# Patient Record
Sex: Female | Born: 1950
Health system: Southern US, Community
[De-identification: ages and names within clinical notes are randomized; demographics above are authoritative.]

## PROBLEM LIST (undated history)

## (undated) DIAGNOSIS — T7840XA Allergy, unspecified, initial encounter: Secondary | ICD-10-CM

## (undated) DIAGNOSIS — D649 Anemia, unspecified: Secondary | ICD-10-CM

## (undated) DIAGNOSIS — N816 Rectocele: Secondary | ICD-10-CM

## (undated) DIAGNOSIS — T4145XA Adverse effect of unspecified anesthetic, initial encounter: Secondary | ICD-10-CM

## (undated) DIAGNOSIS — F419 Anxiety disorder, unspecified: Secondary | ICD-10-CM

## (undated) DIAGNOSIS — T8859XA Other complications of anesthesia, initial encounter: Secondary | ICD-10-CM

## (undated) DIAGNOSIS — K219 Gastro-esophageal reflux disease without esophagitis: Secondary | ICD-10-CM

## (undated) DIAGNOSIS — M1990 Unspecified osteoarthritis, unspecified site: Secondary | ICD-10-CM

## (undated) HISTORY — PX: TONSILLECTOMY: SUR1361

## (undated) HISTORY — PX: COLONOSCOPY: SHX174

## (undated) HISTORY — DX: Unspecified osteoarthritis, unspecified site: M19.90

## (undated) HISTORY — DX: Anemia, unspecified: D64.9

## (undated) HISTORY — DX: Rectocele: N81.6

## (undated) HISTORY — PX: TONSILLECTOMY AND ADENOIDECTOMY: SHX28

## (undated) HISTORY — DX: Allergy, unspecified, initial encounter: T78.40XA

## (undated) HISTORY — PX: WISDOM TOOTH EXTRACTION: SHX21

## (undated) HISTORY — PX: SHOULDER ARTHROSCOPY: SHX128

---

## 1999-09-10 ENCOUNTER — Other Ambulatory Visit: Admission: RE | Admit: 1999-09-10 | Discharge: 1999-09-10 | Payer: Self-pay | Admitting: *Deleted

## 2006-12-22 ENCOUNTER — Ambulatory Visit (HOSPITAL_COMMUNITY): Admission: RE | Admit: 2006-12-22 | Discharge: 2006-12-22 | Payer: Self-pay | Admitting: Obstetrics and Gynecology

## 2007-12-29 ENCOUNTER — Ambulatory Visit (HOSPITAL_COMMUNITY): Admission: RE | Admit: 2007-12-29 | Discharge: 2007-12-29 | Payer: Self-pay | Admitting: Obstetrics and Gynecology

## 2010-03-12 ENCOUNTER — Ambulatory Visit (HOSPITAL_COMMUNITY): Admission: RE | Admit: 2010-03-12 | Discharge: 2010-03-12 | Payer: Self-pay | Admitting: Obstetrics and Gynecology

## 2010-12-15 ENCOUNTER — Encounter: Payer: Self-pay | Admitting: Family Medicine

## 2011-04-30 ENCOUNTER — Other Ambulatory Visit (HOSPITAL_COMMUNITY): Payer: Self-pay | Admitting: Obstetrics and Gynecology

## 2011-04-30 DIAGNOSIS — Z1231 Encounter for screening mammogram for malignant neoplasm of breast: Secondary | ICD-10-CM

## 2011-05-07 ENCOUNTER — Ambulatory Visit (HOSPITAL_COMMUNITY): Payer: PRIVATE HEALTH INSURANCE

## 2011-05-14 ENCOUNTER — Ambulatory Visit (HOSPITAL_COMMUNITY)
Admission: RE | Admit: 2011-05-14 | Discharge: 2011-05-14 | Disposition: A | Discharge status: Home / Self Care | Payer: PRIVATE HEALTH INSURANCE | Source: Ambulatory Visit | Attending: Obstetrics and Gynecology | Admitting: Obstetrics and Gynecology

## 2011-05-14 DIAGNOSIS — Z1231 Encounter for screening mammogram for malignant neoplasm of breast: Secondary | ICD-10-CM

## 2011-11-20 ENCOUNTER — Ambulatory Visit: Payer: Self-pay

## 2011-11-20 DIAGNOSIS — G4700 Insomnia, unspecified: Secondary | ICD-10-CM

## 2011-11-20 DIAGNOSIS — J209 Acute bronchitis, unspecified: Secondary | ICD-10-CM

## 2012-05-18 ENCOUNTER — Other Ambulatory Visit (HOSPITAL_COMMUNITY): Payer: Self-pay | Admitting: Obstetrics and Gynecology

## 2012-05-18 DIAGNOSIS — Z1231 Encounter for screening mammogram for malignant neoplasm of breast: Secondary | ICD-10-CM

## 2012-06-29 ENCOUNTER — Ambulatory Visit (HOSPITAL_COMMUNITY)
Admission: RE | Admit: 2012-06-29 | Discharge: 2012-06-29 | Disposition: A | Discharge status: Home / Self Care | Payer: No Typology Code available for payment source | Source: Ambulatory Visit | Attending: Obstetrics and Gynecology | Admitting: Obstetrics and Gynecology

## 2012-06-29 DIAGNOSIS — Z1231 Encounter for screening mammogram for malignant neoplasm of breast: Secondary | ICD-10-CM | POA: Insufficient documentation

## 2012-08-31 HISTORY — PX: OTHER SURGICAL HISTORY: SHX169

## 2013-05-18 ENCOUNTER — Other Ambulatory Visit (HOSPITAL_COMMUNITY): Payer: Self-pay | Admitting: Obstetrics and Gynecology

## 2013-05-18 DIAGNOSIS — Z1231 Encounter for screening mammogram for malignant neoplasm of breast: Secondary | ICD-10-CM

## 2013-05-18 DIAGNOSIS — Z780 Asymptomatic menopausal state: Secondary | ICD-10-CM

## 2013-06-30 ENCOUNTER — Ambulatory Visit (HOSPITAL_COMMUNITY)
Admission: RE | Admit: 2013-06-30 | Discharge: 2013-06-30 | Disposition: A | Discharge status: Home / Self Care | Payer: BC Managed Care – PPO | Source: Ambulatory Visit | Attending: Obstetrics and Gynecology | Admitting: Obstetrics and Gynecology

## 2013-06-30 ENCOUNTER — Other Ambulatory Visit (HOSPITAL_COMMUNITY): Payer: Self-pay | Admitting: Obstetrics and Gynecology

## 2013-06-30 DIAGNOSIS — Z780 Asymptomatic menopausal state: Secondary | ICD-10-CM

## 2013-06-30 DIAGNOSIS — Z13820 Encounter for screening for osteoporosis: Secondary | ICD-10-CM | POA: Insufficient documentation

## 2013-06-30 DIAGNOSIS — Z1231 Encounter for screening mammogram for malignant neoplasm of breast: Secondary | ICD-10-CM | POA: Insufficient documentation

## 2013-07-05 ENCOUNTER — Other Ambulatory Visit: Payer: Self-pay | Admitting: Orthopedic Surgery

## 2013-07-05 DIAGNOSIS — M25511 Pain in right shoulder: Secondary | ICD-10-CM

## 2013-07-10 ENCOUNTER — Ambulatory Visit
Admission: RE | Admit: 2013-07-10 | Discharge: 2013-07-10 | Disposition: A | Discharge status: Home / Self Care | Payer: BC Managed Care – PPO | Source: Ambulatory Visit | Attending: Orthopedic Surgery | Admitting: Orthopedic Surgery

## 2013-07-10 DIAGNOSIS — M25511 Pain in right shoulder: Secondary | ICD-10-CM

## 2013-07-22 HISTORY — PX: ROTATOR CUFF REPAIR: SHX139

## 2013-09-28 ENCOUNTER — Ambulatory Visit (INDEPENDENT_AMBULATORY_CARE_PROVIDER_SITE_OTHER): Payer: BC Managed Care – PPO | Admitting: General Surgery

## 2013-09-28 ENCOUNTER — Encounter (INDEPENDENT_AMBULATORY_CARE_PROVIDER_SITE_OTHER): Payer: Self-pay | Admitting: General Surgery

## 2013-09-28 ENCOUNTER — Encounter (INDEPENDENT_AMBULATORY_CARE_PROVIDER_SITE_OTHER): Payer: Self-pay

## 2013-09-28 VITALS — BP 150/108 | HR 72 | Temp 98.2°F | Resp 14 | Ht 64.0 in | Wt 113.2 lb

## 2013-09-28 DIAGNOSIS — K602 Anal fissure, unspecified: Secondary | ICD-10-CM | POA: Insufficient documentation

## 2013-09-28 MED ORDER — AMBULATORY NON FORMULARY MEDICATION: 1.0000 "application " | Freq: Four times a day (QID) | Status: DC

## 2013-09-28 NOTE — Progress Notes (Signed)
Patient ID: Stephanie Savage, female   DOB: March 10, 1951, 62 y.o.   MRN: [id number]  Chief Complaint  Patient presents with  . New Evaluation    eval hems    HPI Stephanie Savage is a 62 y.o. female.  We are asked to see the patient in consultation by Dr. Clelia Croft to evaluate her for hemorrhoids. The patient is a 62 year old white female who states that she has a history of a rectocele. She had surgery on her right shoulder in August and took pain medicine for this. The pain medicine caused her to be constipated and shortly thereafter she developed pain and swelling in her rectum. She continues to have a lot of pain with her bowel movements. She has quit eating to try to avoid having bowel movements. She denies a lot of bleeding with her bowel movements  HPI  Past Medical History  Diagnosis Date  . Anemia   . Arthritis   . Allergy     Past Surgical History  Procedure Laterality Date  . Rotator cuff repair Right 07/22/2013  . Shoulder Left [id number]    History reviewed. No pertinent family history.  Social History History  Substance Use Topics  . Smoking status: Never Smoker   . Smokeless tobacco: Never Used  . Alcohol Use: Yes    Allergies  Allergen Reactions  . Codeine Nausea Only  . Sulfa Antibiotics Hives    Current Outpatient Prescriptions  Medication Sig Dispense Refill  . Biotin (BIOTIN 5000) 5 MG CAPS Take by mouth.      . calcium acetate (PHOSLO) 667 MG capsule Take by mouth 3 (three) times daily with meals.      . celecoxib (CELEBREX) 200 MG capsule Take 200 mg by mouth 2 (two) times daily.      . cholecalciferol (VITAMIN D) 1000 UNITS tablet Take 1,000 Units by mouth daily.      Marland Kitchen estradiol-norethindrone (ACTIVELLA) 1-0.5 MG per tablet Take 1 tablet by mouth daily.      . fexofenadine (ALLEGRA) 30 MG tablet Take 30 mg by mouth 2 (two) times daily.      . fish oil-omega-3 fatty acids 1000 MG capsule Take 2 g by mouth daily.      . hydrocortisone (ANUSOL-HC) 25 MG  suppository Place 25 mg rectally 2 (two) times daily.      . hydrocortisone 2.5 % cream Apply topically 2 (two) times daily.      . methocarbamol (ROBAXIN) 500 MG tablet Take 500 mg by mouth 4 (four) times daily.      . methylphenidate (RITALIN) 20 MG tablet Take 20 mg by mouth 2 (two) times daily.      . Multiple Vitamin (MULTIVITAMIN) capsule Take 1 capsule by mouth daily.      . Probiotic Product (PROBIOTIC DAILY PO) Take by mouth.      . pyridOXINE (VITAMIN B-6) 100 MG tablet Take 100 mg by mouth daily.      . AMBULATORY NON FORMULARY MEDICATION Place 1 application rectally 4 (four) times daily. Diltiazem 2% compounded suspension.  1 Tube  2   No current facility-administered medications for this visit.    Review of Systems Review of Systems  Constitutional: Negative.   HENT: Negative.   Eyes: Negative.   Respiratory: Negative.   Cardiovascular: Negative.   Gastrointestinal: Positive for constipation and rectal pain.  Endocrine: Negative.   Genitourinary: Negative.   Musculoskeletal: Negative.   Skin: Negative.   Allergic/Immunologic: Negative.   Neurological: Negative.  Hematological: Negative.   Psychiatric/Behavioral: Negative.     Blood pressure 150/108, pulse 72, temperature 98.2 F (36.8 C), temperature source Temporal, resp. rate 14, height 5\' 4"  (1.626 m), weight 113 lb 3.2 oz (51.347 kg).  Physical Exam Physical Exam  Constitutional: She is oriented to person, place, and time. She appears well-developed and well-nourished.  HENT:  Head: Normocephalic and atraumatic.  Eyes: Conjunctivae and EOM are normal. Pupils are equal, round, and reactive to light.  Neck: Normal range of motion. Neck supple.  Cardiovascular: Normal rate, regular rhythm and normal heart sounds.   Pulmonary/Chest: Effort normal and breath sounds normal.  Abdominal: Soft. Bowel sounds are normal.  Genitourinary:  The patient has normal-appearing perirectal skin. Posteriorly she has a  sentinel tag and an obvious fissure  Musculoskeletal: Normal range of motion.  Neurological: She is alert and oriented to person, place, and time.  Skin: Skin is warm and dry.  Psychiatric: She has a normal mood and affect. Her behavior is normal.    Data Reviewed As above  Assessment    The patient appears to have a posterior anal fissure.     Plan    At this point I would recommend using diltiazem cream 4 times a day. She will take Colace and MiraLAX to have a normal soft bowel movement every day. She will use baby wipes after bowel movements. She will soak in a tub 2-3 times a day and after bowel movements. We will have her followup in 1 month with Dr. Maisie Fus who is our colorectal specialist to monitor her progress        TOTH III,PAUL S 09/28/2013, 10:41 AM

## 2013-09-28 NOTE — Patient Instructions (Signed)
Use colace and miralax to have soft bm everyday Warm tub soaks 2-3 times a day Baby wipes after bm's Diltiazem cream to rectum 4 times a day

## 2013-10-26 ENCOUNTER — Ambulatory Visit (INDEPENDENT_AMBULATORY_CARE_PROVIDER_SITE_OTHER): Payer: BC Managed Care – PPO | Admitting: General Surgery

## 2013-10-26 ENCOUNTER — Encounter (INDEPENDENT_AMBULATORY_CARE_PROVIDER_SITE_OTHER): Payer: Self-pay | Admitting: General Surgery

## 2013-10-26 VITALS — BP 150/100 | HR 76 | Temp 98.0°F | Resp 14 | Ht 64.0 in | Wt 111.4 lb

## 2013-10-26 DIAGNOSIS — K602 Anal fissure, unspecified: Secondary | ICD-10-CM

## 2013-10-26 DIAGNOSIS — K648 Other hemorrhoids: Secondary | ICD-10-CM

## 2013-10-26 DIAGNOSIS — K642 Third degree hemorrhoids: Secondary | ICD-10-CM

## 2013-10-26 NOTE — Progress Notes (Signed)
Chief Complaint  Patient presents with  . New Evaluation    eval for hems per toth    HISTORY: Stephanie Savage is a 62 y.o. female who presents to the office with rectal pain after BM's.  Other symptoms include bleeding, incomplete evacuation.  This had been occurring since Aug.  She has tried miralax and diltiazem cream in the past with some success.  Multiple BM's makes the symptoms worse.   It is intermittent in nature.  Her bowel habits are regular and her bowel movements are loose.  her fiber intake is dietary.  She has never had a colonoscopy.  She does have a know rectocele and she splints her vagina.  Past Medical History  Diagnosis Date  . Anemia   . Arthritis   . Allergy       Past Surgical History  Procedure Laterality Date  . Rotator cuff repair Right 07/22/2013  . Shoulder Left [id number]        Current Outpatient Prescriptions  Medication Sig Dispense Refill  . AMBULATORY NON FORMULARY MEDICATION Place 1 application rectally 4 (four) times daily. Diltiazem 2% compounded suspension.  1 Tube  2  . Biotin (BIOTIN 5000) 5 MG CAPS Take by mouth.      . calcium acetate (PHOSLO) 667 MG capsule Take by mouth 3 (three) times daily with meals.      . celecoxib (CELEBREX) 200 MG capsule Take 200 mg by mouth 2 (two) times daily.      . cholecalciferol (VITAMIN D) 1000 UNITS tablet Take 1,000 Units by mouth daily.      Marland Kitchen estradiol-norethindrone (ACTIVELLA) 1-0.5 MG per tablet Take 1 tablet by mouth daily.      . fexofenadine (ALLEGRA) 30 MG tablet Take 30 mg by mouth 2 (two) times daily.      . fish oil-omega-3 fatty acids 1000 MG capsule Take 2 g by mouth daily.      . hydrocortisone (ANUSOL-HC) 25 MG suppository Place 25 mg rectally 2 (two) times daily.      . hydrocortisone 2.5 % cream Apply topically 2 (two) times daily.      . methocarbamol (ROBAXIN) 500 MG tablet Take 500 mg by mouth 4 (four) times daily.      . methylphenidate (RITALIN) 20 MG tablet Take 20 mg by mouth 2  (two) times daily.      . Multiple Vitamin (MULTIVITAMIN) capsule Take 1 capsule by mouth daily.      . Probiotic Product (PROBIOTIC DAILY PO) Take by mouth.      . pyridOXINE (VITAMIN B-6) 100 MG tablet Take 100 mg by mouth daily.       No current facility-administered medications for this visit.      Allergies  Allergen Reactions  . Codeine Nausea Only  . Sulfa Antibiotics Hives      History reviewed. No pertinent family history.  History   Social History  . Marital Status: Married    Spouse Name: N/A    Number of Children: N/A  . Years of Education: N/A   Social History Main Topics  . Smoking status: Never Smoker   . Smokeless tobacco: Never Used  . Alcohol Use: Yes  . Drug Use: No  . Sexual Activity: None   Other Topics Concern  . None   Social History Narrative  . None      REVIEW OF SYSTEMS - PERTINENT POSITIVES ONLY: Review of Systems - General ROS: negative for - chills, fever or weight loss  Hematological and Lymphatic ROS: negative for - bleeding problems, blood clots or bruising Respiratory ROS: no cough, shortness of breath, or wheezing Cardiovascular ROS: no chest pain or dyspnea on exertion Gastrointestinal ROS: no abdominal pain, change in bowel habits, or black or bloody stools Genito-Urinary ROS: no dysuria, trouble voiding, or hematuria  EXAM: Filed Vitals:   10/26/13 1034  BP: 150/100  Pulse: 76  Temp: 98 F (36.7 C)  Resp: 14    General appearance: alert and cooperative Resp: clear to auscultation bilaterally Cardio: regular rate and rhythm GI: soft, non-tender; bowel sounds normal; no masses,  no organomegaly  Anal Exam Findings: edematous grade 3 internal hemorrhoid, posterior anal fissure with sentinel tag    ASSESSMENT AND PLAN: Stephanie Savage is a 89 y.o. F with a posterior anal fissure and prolapsing internal hemorrhoid.  I would like her to continue her diltiazem cream for 4 more weeks.  I will see her again then.  I have  asked her to start a methylcellulose fiber supplement to help with her loose stools.  I recommended that she get a colonoscopy once this has resolved.      Vanita Panda, MD Colon and Rectal Surgery / General Surgery [facility]      Visit Diagnoses: No diagnosis found.  Primary Care Physician: Kari Baars, MD

## 2013-10-26 NOTE — Patient Instructions (Signed)
GETTING TO GOOD BOWEL HEALTH. Irregular bowel habits such as diarrhea can lead to many problems over time.  Having one soft, formed bowel movement a day is the most important way to prevent further problems.  The anorectal canal is designed to handle stretching and feces to safely manage our ability to get rid of solid waste (feces, poop, stool) out of our body.  BUT, diarrhea can be a burning fire to this very sensitive area of our body, causing inflamed hemorrhoids, anal fissures, increasing risk is perirectal abscesses, abdominal pain and bloating.     The goal: ONE SOFT BOWEL MOVEMENT A DAY!  To have soft, regular bowel movements:    Drink at least 8 tall glasses of water a day.     Take plenty of fiber.  Fiber is the undigested part of plant food that passes into the colon, acting s "natures broom" to encourage bowel motility and movement.  Fiber can absorb and hold large amounts of water. This results in a larger, bulkier stool, which is soft and easier to pass. Work gradually over several weeks up to 6 servings a day of fiber (25g a day even more if needed) in the form of: o Vegetables -- Root (potatoes, carrots, turnips), leafy green (lettuce, salad greens, celery, spinach), or cooked high residue (cabbage, broccoli, etc) o Fruit -- Fresh (unpeeled skin & pulp), Dried (prunes, apricots, cherries, etc ),  or stewed ( applesauce)  o Whole grain breads, pasta, etc (whole wheat)  o Bran cereals    Bulking Agents -- This type of water-retaining fiber generally is easily obtained each day by one of the following:  o Methylcellulose -- This is another fiber derived from wood which also retains water. It is available as Citrucel and Benefiber. o Flax Seed - a less gassy fiber than psyllium   Controlling diarrhea o Switch to liquids and simpler foods for a few days to avoid stressing your intestines further. o Avoid dairy products (especially milk & ice cream) for a short time.  The intestines often can  lose the ability to digest lactose when stressed. o Avoid foods that cause gassiness or bloating.  Typical foods include beans and other legumes, cabbage, broccoli, and dairy foods.  Every person has some sensitivity to other foods, so listen to our body and avoid those foods that trigger problems for you. o Adding fiber (Citrucel, Metamucil, psyllium, Miralax) gradually can help thicken stools by absorbing excess fluid and retrain the intestines to act more normally.  Slowly increase the dose over a few weeks.  Too much fiber too soon can backfire and cause cramping & bloating. o Probiotics (such as active yogurt, Align, etc) may help repopulate the intestines and colon with normal bacteria and calm down a sensitive digestive tract.  Most studies show it to be of mild help, though, and such products can be costly. o Medicines:   Bismuth subsalicylate (ex. Kayopectate, Pepto Bismol) every 30 minutes for up to 6 doses can help control diarrhea.  Avoid if pregnant.   Loperamide (Immodium) can slow down diarrhea.  Start with two tablets (4mg  total) first and then try one tablet every 6 hours.  Avoid if you are having fevers or severe pain.  If you are not better or start feeling worse, stop all medicines and call your doctor for advice o Call your doctor if you are getting worse or not better.  Sometimes further testing (cultures, endoscopy, X-ray studies, bloodwork, etc) may be needed to help  diagnose and treat the cause of the diarrhea. o

## 2013-11-30 ENCOUNTER — Ambulatory Visit (INDEPENDENT_AMBULATORY_CARE_PROVIDER_SITE_OTHER): Payer: BC Managed Care – PPO | Admitting: General Surgery

## 2013-11-30 ENCOUNTER — Encounter (INDEPENDENT_AMBULATORY_CARE_PROVIDER_SITE_OTHER): Payer: Self-pay | Admitting: General Surgery

## 2013-11-30 ENCOUNTER — Telehealth (INDEPENDENT_AMBULATORY_CARE_PROVIDER_SITE_OTHER): Payer: Self-pay | Admitting: General Surgery

## 2013-11-30 VITALS — BP 140/90 | HR 68 | Temp 97.8°F | Resp 14 | Ht 64.0 in | Wt 110.0 lb

## 2013-11-30 DIAGNOSIS — K602 Anal fissure, unspecified: Secondary | ICD-10-CM

## 2013-11-30 NOTE — Patient Instructions (Signed)
[facility]  ONE-DAY (1) PRE-OP HOME COLON PREP INSTRUCTIONS: ** MIRALAX / GATORADE PREP **  You must follow the instructions below carefully.  If you have questions or problems, please call and speak to someone in the clinic department at our office:   [phone].     INSTRUCTIONS: 1. Five days prior to your procedure do not eat nuts, popcorn, or fruit with seeds.  Stop all fiber supplements such as Metamucil, Citrucel, etc. 2. Two days before surgery fill the prescription at a pharmacy of your choice and purchase the additional supplies below.         MIRALAX - GATORADE -- DULCOLAX TABS:   Purchase a bottle of MIRALAX  (238 gm bottle)    In addition, purchase four (4) DULCOLAX TABLETS (no prescription required- ask the pharmacist if you can't find them)    Purchase one 64 oz GATORADE.  (Do NOT purchase red Gatorade; any other flavor is acceptable) and place in refrigerator to get cold.  3.   Day Before Surgery:   6 am: take the 4 Dulcolax tablets   You may only have clear liquids (tea, coffee, juice, broth, jello, soft drinks, gummy bears).  You cannot have solid foods, cream, milk or milk products.  Drink at lease 8 ounces of liquids every hour while awake.   Mix the entire bottle of MiraLax and the Gatorade in a large container.    10:00am: Begin drinking the Gatorade mixture until gone (8 oz every 15-30 minutes).      You may suck on a lime wedge or hard candy to "freshen your palate" in between glasses   If you are a diabetic, take your blood sugar reading several time throughout the prep.  Have some juice available to take if your sugar level gets too low   You may feel chilled while taking the prep.  Have some warm tea or broth to help warm up.   Continue clear liquids until midnight or bedtime  3. The day of your procedure:   Do not eat or drink ANYTHING after midnight before your surgery.     If you take Heart or Blood Pressure medicine, ask the pre-op nurses about  these during your preop appointment.   Further pre-operative instructions will be given to you from the hospital.   Expect to be contacted 5-7 days before your surgery.  

## 2013-11-30 NOTE — Telephone Encounter (Signed)
That's fine

## 2013-11-30 NOTE — Telephone Encounter (Signed)
Patient met with surgery scheduling wants to schedule in March

## 2013-11-30 NOTE — Progress Notes (Signed)
Chief Complaint   Patient presents with   .  New Evaluation     eval for hems per toth   HISTORY: Stephanie Savage is a 63 y.o. female who presented to the office in early Dec with rectal pain after BM's. Other symptoms included bleeding, incomplete evacuation. This had been occurring since Aug. She has tried miralax and diltiazem cream in the past with some success. Multiple BM's maked the symptoms worse. It was intermittent in nature. Her bowel habits are regular and her bowel movements are loose. her fiber intake is dietary. She has never had a colonoscopy. She does have a know rectocele and she splints her vagina.  She has been on the diltiazem for a full course and has been using citrucel bid to help thicken her stools.  This has worked well and her pain is minimal now.   EXAM:  Filed Vitals:   11/30/13 1018  BP: 140/90  Pulse: 68  Temp: 97.8 F (36.6 C)  Resp: 14   General appearance: alert and cooperative  GI: soft, non-tender; bowel sounds normal; no masses, no organomegaly  Anal Exam Findings: grade 3 internal hemorrhoid, posterior anal fissure healing, sentinel tag, no sphincter hypertension noted  ASSESSMENT AND PLAN:  Stephanie Savage is a 63 y.o. F with a posterior anal fissure and prolapsing internal hemorrhoid.  She will cont her fiber supplements.  I have scheduled her for a colonoscopy in March, once this has resolved for screening purposes.  [name], MD  Colon and Rectal Surgery / General Surgery  [facility]

## 2014-01-19 ENCOUNTER — Telehealth (INDEPENDENT_AMBULATORY_CARE_PROVIDER_SITE_OTHER): Payer: Self-pay | Admitting: General Surgery

## 2014-01-19 NOTE — Telephone Encounter (Signed)
Talked to patient on 01/14/14 went over financial responsibilities, patient was going to call back to schedule.

## 2014-06-09 ENCOUNTER — Other Ambulatory Visit (HOSPITAL_COMMUNITY): Payer: Self-pay | Admitting: Obstetrics and Gynecology

## 2014-06-09 DIAGNOSIS — Z1231 Encounter for screening mammogram for malignant neoplasm of breast: Secondary | ICD-10-CM

## 2014-07-01 ENCOUNTER — Ambulatory Visit (HOSPITAL_COMMUNITY)
Admission: RE | Admit: 2014-07-01 | Discharge: 2014-07-01 | Disposition: A | Discharge status: Home / Self Care | Payer: BC Managed Care – PPO | Source: Ambulatory Visit | Attending: Obstetrics and Gynecology | Admitting: Obstetrics and Gynecology

## 2014-07-01 DIAGNOSIS — Z1231 Encounter for screening mammogram for malignant neoplasm of breast: Secondary | ICD-10-CM | POA: Insufficient documentation

## 2015-05-10 ENCOUNTER — Encounter: Payer: Self-pay | Admitting: Internal Medicine

## 2015-05-31 ENCOUNTER — Other Ambulatory Visit (HOSPITAL_COMMUNITY): Payer: Self-pay | Admitting: Obstetrics and Gynecology

## 2015-05-31 DIAGNOSIS — Z1231 Encounter for screening mammogram for malignant neoplasm of breast: Secondary | ICD-10-CM

## 2015-06-29 ENCOUNTER — Ambulatory Visit (AMBULATORY_SURGERY_CENTER): Payer: Self-pay

## 2015-06-29 VITALS — Ht 64.5 in | Wt 115.4 lb

## 2015-06-29 DIAGNOSIS — Z1211 Encounter for screening for malignant neoplasm of colon: Secondary | ICD-10-CM

## 2015-06-29 NOTE — Progress Notes (Signed)
No allergies to eggs or soy No diet/weight loss meds No home oxygen No past problems with anesthesia; has felt like she couldn't breathe in the past   Has email  Emmi instructions given for colonoscopy

## 2015-07-03 ENCOUNTER — Ambulatory Visit (HOSPITAL_COMMUNITY)
Admission: RE | Admit: 2015-07-03 | Discharge: 2015-07-03 | Disposition: A | Discharge status: Home / Self Care | Payer: PPO | Source: Ambulatory Visit | Attending: Obstetrics and Gynecology | Admitting: Obstetrics and Gynecology

## 2015-07-03 DIAGNOSIS — Z1231 Encounter for screening mammogram for malignant neoplasm of breast: Secondary | ICD-10-CM | POA: Diagnosis present

## 2015-07-13 ENCOUNTER — Ambulatory Visit (AMBULATORY_SURGERY_CENTER): Payer: PPO | Admitting: Internal Medicine

## 2015-07-13 ENCOUNTER — Encounter: Payer: Self-pay | Admitting: Internal Medicine

## 2015-07-13 VITALS — BP 148/79 | HR 67 | Temp 97.4°F | Resp 43 | Ht 64.0 in | Wt 115.0 lb

## 2015-07-13 DIAGNOSIS — Z1211 Encounter for screening for malignant neoplasm of colon: Secondary | ICD-10-CM | POA: Diagnosis not present

## 2015-07-13 MED ORDER — SODIUM CHLORIDE 0.9 % IV SOLN: 500.0000 mL | INTRAVENOUS | Status: DC

## 2015-07-13 NOTE — Progress Notes (Signed)
Transferred to recovery room. A/O x3, pleased with MAC.  VSS.  Report to Jill, RN. 

## 2015-07-13 NOTE — Patient Instructions (Addendum)
YOU HAD AN ENDOSCOPIC PROCEDURE TODAY AT THE [facility]:   Refer to the procedure report that was given to you for any specific questions about what was found during the examination.  If the procedure report does not answer your questions, please call your gastroenterologist to clarify.  If you requested that your care partner not be given the details of your procedure findings, then the procedure report has been included in a sealed envelope for you to review at your convenience later.  YOU SHOULD EXPECT: Some feelings of bloating in the abdomen. Passage of more gas than usual.  Walking can help get rid of the air that was put into your GI tract during the procedure and reduce the bloating. If you had a lower endoscopy (such as a colonoscopy or flexible sigmoidoscopy) you may notice spotting of blood in your stool or on the toilet paper. If you underwent a bowel prep for your procedure, you may not have a normal bowel movement for a few days.  Please Note:  You might notice some irritation and congestion in your nose or some drainage.  This is from the oxygen used during your procedure.  There is no need for concern and it should clear up in a day or so.  SYMPTOMS TO REPORT IMMEDIATELY:   Following lower endoscopy (colonoscopy or flexible sigmoidoscopy):  Excessive amounts of blood in the stool  Significant tenderness or worsening of abdominal pains  Swelling of the abdomen that is new, acute  Fever of 100F or higher   For urgent or emergent issues, a gastroenterologist can be reached at any hour by calling [phone].   DIET: Your first meal following the procedure should be a small meal and then it is ok to progress to your normal diet. Heavy or fried foods are harder to digest and may make you feel nauseous or bloated.  Likewise, meals heavy in dairy and vegetables can increase bloating.  Drink plenty of fluids but you should avoid alcoholic beverages for 24  hours.  ACTIVITY:  You should plan to take it easy for the rest of today and you should NOT DRIVE or use heavy machinery until tomorrow (because of the sedation medicines used during the test).    FOLLOW UP: Our staff will call the number listed on your records the next business day following your procedure to check on you and address any questions or concerns that you may have regarding the information given to you following your procedure. If we do not reach you, we will leave a message.  However, if you are feeling well and you are not experiencing any problems, there is no need to return our call.  We will assume that you have returned to your regular daily activities without incident.  If any biopsies were taken you will be contacted by phone or by letter within the next 1-3 weeks.  Please call us at [phone] if you have not heard about the biopsies in 3 weeks.    SIGNATURES/CONFIDENTIALITY: You and/or your care partner have signed paperwork which will be entered into your electronic medical record.  These signatures attest to the fact that that the information above on your After Visit Summary has been reviewed and is understood.  Full responsibility of the confidentiality of this discharge information lies with you and/or your care-partner.    Handouts were given to your care partner on hemorrhoids, diverticulosis  and a high fiber diet with liberal fluid intake You may  resume your current medications today. Please call if any questions or concerns.

## 2015-07-13 NOTE — Progress Notes (Signed)
No problems noted in the recovery room. maw 

## 2015-07-13 NOTE — Op Note (Signed)
[facility] [address] [place], [address]   COLONOSCOPY PROCEDURE REPORT  PATIENT: Stephanie Savage, Stephanie Savage  MR#: [id number] BIRTHDATE: 06-01-1951 , 63  yrs. old GENDER: female ENDOSCOPIST: Beverley Fiedler, MD REFERRED BY:W.  Buren Kos, M.D. PROCEDURE DATE:  07/13/2015 PROCEDURE:   Colonoscopy, screening First Screening Colonoscopy - Avg.  risk and is 50 yrs.  old or older Yes.  Prior Negative Screening - Now for repeat screening. N/A  History of Adenoma - Now for follow-up colonoscopy & has been > or = to 3 yrs.  N/A  Polyps removed today? No Recommend repeat exam, <10 yrs? No ASA CLASS:   Class II INDICATIONS:Screening for colonic neoplasia and Colorectal Neoplasm Risk Assessment for this procedure is average risk. MEDICATIONS: Monitored anesthesia care and Propofol 300 mg IV  DESCRIPTION OF PROCEDURE:   After the risks benefits and alternatives of the procedure were thoroughly explained, informed consent was obtained.  The digital rectal exam revealed internal hemorrhoids.   The [id number]  endoscope was introduced through the anus and advanced to the cecum, which was identified by both the appendix and ileocecal valve. No adverse events experienced.   The quality of the prep was good.  (MiraLax was used)  The instrument was then slowly withdrawn as the colon was fully examined. Estimated blood loss is zero unless otherwise noted in this procedure report.    COLON FINDINGS: Melanosis coli was found throughout the entire examined colon.   There was severe diverticulosis noted in the left colon with associated angulation and tortuosity. No polyps or cancer seen. Retroflexed views revealed internal hemorrhoids and Retroflexed views revealed external hemorrhoids. The time to cecum = 12.5 Withdrawal time = 8.4   The scope was withdrawn and the procedure completed.  COMPLICATIONS: There were no immediate complications.  ENDOSCOPIC IMPRESSION: 1.    Melanosis coli was found throughout the entire examined colon 2.   There was severe diverticulosis noted in the left colon  RECOMMENDATIONS: 1.  High fiber diet 2.  You should continue to follow colorectal cancer screening guidelines for "routine risk" patients with a repeat colonoscopy in 10 years.  There is no need for FOBT (stool) testing for at least 5 years.  eSigned:  Beverley Fiedler, MD 07/13/2015 12:08 PM   cc: The Patient and W.  Buren Kos, MD

## 2015-07-14 ENCOUNTER — Telehealth: Payer: Self-pay

## 2015-07-14 NOTE — Telephone Encounter (Signed)
  Follow up Call-  Call back number 07/13/2015  Post procedure Call Back phone  # [phone]  Permission to leave phone message Yes     Patient questions:  Do you have a fever, pain , or abdominal swelling? No. Pain Score  0 *  Have you tolerated food without any problems? Yes.    Have you been able to return to your normal activities? Yes.    Do you have any questions about your discharge instructions: Diet   No. Medications  No. Follow up visit  No.  Do you have questions or concerns about your Care? Yes.    Actions: * If pain score is 4 or above: No action needed, pain <4.

## 2015-11-28 DIAGNOSIS — F908 Attention-deficit hyperactivity disorder, other type: Secondary | ICD-10-CM | POA: Diagnosis not present

## 2015-11-30 DIAGNOSIS — G8929 Other chronic pain: Secondary | ICD-10-CM | POA: Insufficient documentation

## 2015-11-30 DIAGNOSIS — M25511 Pain in right shoulder: Secondary | ICD-10-CM | POA: Insufficient documentation

## 2015-11-30 DIAGNOSIS — M19011 Primary osteoarthritis, right shoulder: Secondary | ICD-10-CM | POA: Diagnosis not present

## 2016-02-21 DIAGNOSIS — Z0000 Encounter for general adult medical examination without abnormal findings: Secondary | ICD-10-CM | POA: Diagnosis not present

## 2016-02-28 DIAGNOSIS — Z0000 Encounter for general adult medical examination without abnormal findings: Secondary | ICD-10-CM | POA: Diagnosis not present

## 2016-02-28 DIAGNOSIS — Z23 Encounter for immunization: Secondary | ICD-10-CM | POA: Diagnosis not present

## 2016-02-28 DIAGNOSIS — Z1389 Encounter for screening for other disorder: Secondary | ICD-10-CM | POA: Diagnosis not present

## 2016-02-28 DIAGNOSIS — M25519 Pain in unspecified shoulder: Secondary | ICD-10-CM | POA: Diagnosis not present

## 2016-02-28 DIAGNOSIS — R030 Elevated blood-pressure reading, without diagnosis of hypertension: Secondary | ICD-10-CM | POA: Diagnosis not present

## 2016-02-28 DIAGNOSIS — M19049 Primary osteoarthritis, unspecified hand: Secondary | ICD-10-CM | POA: Diagnosis not present

## 2016-02-28 DIAGNOSIS — Z6820 Body mass index (BMI) 20.0-20.9, adult: Secondary | ICD-10-CM | POA: Diagnosis not present

## 2016-02-28 DIAGNOSIS — F900 Attention-deficit hyperactivity disorder, predominantly inattentive type: Secondary | ICD-10-CM | POA: Diagnosis not present

## 2016-02-28 DIAGNOSIS — Z79890 Hormone replacement therapy (postmenopausal): Secondary | ICD-10-CM | POA: Diagnosis not present

## 2016-03-13 DIAGNOSIS — M19011 Primary osteoarthritis, right shoulder: Secondary | ICD-10-CM | POA: Diagnosis not present

## 2016-03-26 ENCOUNTER — Other Ambulatory Visit: Payer: Self-pay | Admitting: Orthopedic Surgery

## 2016-03-26 DIAGNOSIS — M19011 Primary osteoarthritis, right shoulder: Secondary | ICD-10-CM

## 2016-03-26 DIAGNOSIS — M25511 Pain in right shoulder: Secondary | ICD-10-CM

## 2016-04-04 ENCOUNTER — Ambulatory Visit
Admission: RE | Admit: 2016-04-04 | Discharge: 2016-04-04 | Disposition: A | Discharge status: Home / Self Care | Payer: PPO | Source: Ambulatory Visit | Attending: Orthopedic Surgery | Admitting: Orthopedic Surgery

## 2016-04-04 DIAGNOSIS — M19011 Primary osteoarthritis, right shoulder: Secondary | ICD-10-CM

## 2016-04-04 DIAGNOSIS — M25511 Pain in right shoulder: Secondary | ICD-10-CM

## 2016-04-04 DIAGNOSIS — S43021A Posterior subluxation of right humerus, initial encounter: Secondary | ICD-10-CM | POA: Diagnosis not present

## 2016-04-08 ENCOUNTER — Other Ambulatory Visit: Payer: Self-pay | Admitting: Orthopedic Surgery

## 2016-04-11 NOTE — Pre-Procedure Instructions (Signed)
Laren EvertsBarbara A Charlie  04/11/2016      [id number] [place], [place] - [address] [facility] [address] [place] [id number] Phone: [phone] Fax: [phone]    Your procedure is scheduled on Thursday, May 25th, 2017  [facility] Admitting at 5:30 A.M.  Call this number if you have problems the morning of surgery:  [phone]   Remember:  Do not eat food or drink liquids after midnight.  Take these medicines the morning of surgery with A SIP OF WATER Flonase, Allegra, Acteivella  7 days prior to surgery stop taking aspirin, aleve, ibuprofen, naproxen, advil, Goody's, BC's, fish oil, all herbal medications, and vitamins, Celebrex   Do not wear jewelry, make-up or nail polish.  Do not wear lotions, powders, or perfumes.  You may wear deodorant.  Do not shave 48 hours prior to surgery.  Men may shave face and neck.  Do not bring valuables to the hospital.  [facility] any belongings or valuables.  Contacts, dentures or bridgework may not be worn into surgery.  Leave your suitcase in the car.  After surgery it may be brought to your room.  For patients admitted to the hospital, discharge time will be determined by your treatment team.  Patients discharged the day of surgery will not be allowed to drive home.    Special instructions: [place] - Preparing for Surgery  Before surgery, you can play an important role.  Because skin is not sterile, your skin needs to be as free of germs as possible.  You can reduce the number of germs on you skin by washing with CHG (chlorahexidine gluconate) soap before surgery.  CHG is an antiseptic cleaner which kills germs and bonds with the skin to continue killing germs even after washing.  Please DO NOT use if you have an allergy to CHG or antibacterial soaps.  If your skin becomes reddened/irritated stop using the CHG and inform your nurse when  you arrive at Short Stay.  Do not shave (including legs and underarms) for at least 48 hours prior to the first CHG shower.  You may shave your face.  Please follow these instructions carefully:   1.  Shower with CHG Soap the night before surgery and the                                morning of Surgery.  2.  If you choose to wash your hair, wash your hair first as usual with your       normal shampoo.  3.  After you shampoo, rinse your hair and body thoroughly to remove the                      Shampoo.  4.  Use CHG as you would any other liquid soap.  You can apply chg directly       to the skin and wash gently with scrungie or a clean washcloth.  5.  Apply the CHG Soap to your body ONLY FROM THE NECK DOWN.        Do not use on open wounds or open sores.  Avoid contact with your eyes,       ears, mouth and genitals (private parts).  Wash genitals (private parts)       with your normal soap.  6.  Wash thoroughly, paying special attention to the area where your surgery        will be performed.  7.  Thoroughly rinse your body with warm water from the neck down.  8.  DO NOT shower/wash with your normal soap after using and rinsing off       the CHG Soap.  9.  Pat yourself dry with a clean towel.            10.  Wear clean pajamas.            11.  Place clean sheets on your bed the night of your first shower and do not        sleep with pets.  Day of Surgery  Do not apply any lotions/deoderants the morning of surgery.  Please wear clean clothes to the hospital/surgery center.     Please read over the following fact sheets that you were given. Pain Booklet, Coughing and Deep Breathing and Surgical Site Infection Prevention

## 2016-04-12 ENCOUNTER — Encounter (HOSPITAL_COMMUNITY)
Admission: RE | Admit: 2016-04-12 | Discharge: 2016-04-12 | Disposition: A | Discharge status: Home / Self Care | Payer: PPO | Source: Ambulatory Visit | Attending: Orthopedic Surgery | Admitting: Orthopedic Surgery

## 2016-04-12 ENCOUNTER — Encounter (HOSPITAL_COMMUNITY): Payer: Self-pay

## 2016-04-12 DIAGNOSIS — R0989 Other specified symptoms and signs involving the circulatory and respiratory systems: Secondary | ICD-10-CM | POA: Diagnosis not present

## 2016-04-12 DIAGNOSIS — Z01812 Encounter for preprocedural laboratory examination: Secondary | ICD-10-CM | POA: Insufficient documentation

## 2016-04-12 DIAGNOSIS — R9431 Abnormal electrocardiogram [ECG] [EKG]: Secondary | ICD-10-CM | POA: Diagnosis not present

## 2016-04-12 DIAGNOSIS — Z01818 Encounter for other preprocedural examination: Secondary | ICD-10-CM

## 2016-04-12 DIAGNOSIS — M19011 Primary osteoarthritis, right shoulder: Secondary | ICD-10-CM | POA: Insufficient documentation

## 2016-04-12 HISTORY — DX: Other complications of anesthesia, initial encounter: T88.59XA

## 2016-04-12 HISTORY — DX: Anxiety disorder, unspecified: F41.9

## 2016-04-12 HISTORY — DX: Adverse effect of unspecified anesthetic, initial encounter: T41.45XA

## 2016-04-12 LAB — COMPREHENSIVE METABOLIC PANEL
ALT: 29 U/L (ref 14–54)
AST: 31 U/L (ref 15–41)
Albumin: 3.8 g/dL (ref 3.5–5.0)
Alkaline Phosphatase: 57 U/L (ref 38–126)
Anion gap: 11 (ref 5–15)
BUN: 29 mg/dL — ABNORMAL HIGH (ref 6–20)
CO2: 26 mmol/L (ref 22–32)
Calcium: 10 mg/dL (ref 8.9–10.3)
Chloride: 102 mmol/L (ref 101–111)
Creatinine, Ser: 1.01 mg/dL — ABNORMAL HIGH (ref 0.44–1.00)
GFR calc Af Amer: >60 mL/min (ref >60)
GFR calc non Af Amer: 57 mL/min — ABNORMAL LOW (ref >60)
Glucose, Bld: 115 mg/dL — ABNORMAL HIGH (ref 65–99)
Potassium: 4.1 mmol/L (ref 3.5–5.1)
Sodium: 139 mmol/L (ref 135–145)
Total Bilirubin: 0.6 mg/dL (ref 0.3–1.2)
Total Protein: 6.5 g/dL (ref 6.5–8.1)

## 2016-04-12 LAB — URINALYSIS, ROUTINE W REFLEX MICROSCOPIC
Bilirubin Urine: NEGATIVE
Glucose, UA: NEGATIVE mg/dL
Hgb urine dipstick: NEGATIVE
Ketones, ur: 15 mg/dL — AB
Leukocytes, UA: NEGATIVE
Nitrite: NEGATIVE
Protein, ur: NEGATIVE mg/dL
Specific Gravity, Urine: 1.022 (ref 1.005–1.030)
pH: 6 (ref 5.0–8.0)

## 2016-04-12 LAB — CBC WITH DIFFERENTIAL/PLATELET
Basophils Absolute: 0 10*3/uL (ref 0.0–0.1)
Basophils Relative: 0 %
Eosinophils Absolute: 0.1 10*3/uL (ref 0.0–0.7)
Eosinophils Relative: 2 %
HCT: 44.4 % (ref 36.0–46.0)
Hemoglobin: 14.5 g/dL (ref 12.0–15.0)
Lymphocytes Relative: 21 %
Lymphs Abs: 1.6 10*3/uL (ref 0.7–4.0)
MCH: 31 pg (ref 26.0–34.0)
MCHC: 32.7 g/dL (ref 30.0–36.0)
MCV: 94.9 fL (ref 78.0–100.0)
Monocytes Absolute: 0.6 10*3/uL (ref 0.1–1.0)
Monocytes Relative: 8 %
Neutro Abs: 5.4 10*3/uL (ref 1.7–7.7)
Neutrophils Relative %: 69 %
Platelets: 285 10*3/uL (ref 150–400)
RBC: 4.68 MIL/uL (ref 3.87–5.11)
RDW: 12.6 % (ref 11.5–15.5)
WBC: 7.7 10*3/uL (ref 4.0–10.5)

## 2016-04-12 LAB — SURGICAL PCR SCREEN
MRSA, PCR: NEGATIVE
Staphylococcus aureus: POSITIVE — AB

## 2016-04-12 LAB — APTT: aPTT: 24 seconds (ref 24–37)

## 2016-04-12 LAB — PROTIME-INR
INR: 1.04 (ref 0.00–1.49)
Prothrombin Time: 13.8 seconds (ref 11.6–15.2)

## 2016-04-12 NOTE — Progress Notes (Signed)
Requested EKG from Dr Martha ClanWilliam Shaw.

## 2016-04-12 NOTE — Progress Notes (Signed)
PCP is Dr. Martha ClanWilliam Shaw Denies ever seeing a cardiologist. Denies ever having a card cath, stress test, or echo.

## 2016-04-12 NOTE — Progress Notes (Signed)
Message left for Stephanie Savage to inform her of PCR results and the need to pick up the script for Mupirocin. [facility] and [facility].

## 2016-04-15 NOTE — Progress Notes (Addendum)
Anesthesia Chart Review:  Pt is a 65 year old female scheduled for R total shoulder arthroplasty on 04/18/2016 with Dr. Ave Filterhandler.   PCP is Dr. Martha ClanWilliam Shaw.   PMH includes:  Anemia. Never smoker. BMI 19.5  Preoperative labs reviewed.    Chest x-ray 04/12/16 reviewed. No acute cardiopulmonary disease  EKG 04/12/16: NSR. Left axis deviation. Septal infarct, age undetermined  Stephanie Chockllison Zelenak, PA spoke with pt by phone. She denies any CV sx and reports doing cardio exercise 5 days per week.   Reviewed case with Dr. Desmond Lopeurk.   If no changes, I anticipate pt can proceed with surgery as scheduled.   Stephanie Mastngela Kabbe, FNP-BC [facility]iology Phone: [phone] 04/17/2016 12:48 PM

## 2016-04-18 ENCOUNTER — Encounter (HOSPITAL_COMMUNITY): Payer: Self-pay | Admitting: *Deleted

## 2016-04-18 ENCOUNTER — Inpatient Hospital Stay (HOSPITAL_COMMUNITY): Payer: PPO

## 2016-04-18 ENCOUNTER — Inpatient Hospital Stay (HOSPITAL_COMMUNITY)
Admission: RE | Admit: 2016-04-18 | Discharge: 2016-04-19 | DRG: 483 | Disposition: A | Discharge status: Home / Self Care | Payer: PPO | Source: Ambulatory Visit | Attending: Orthopedic Surgery | Admitting: Orthopedic Surgery

## 2016-04-18 ENCOUNTER — Inpatient Hospital Stay (HOSPITAL_COMMUNITY): Payer: PPO | Admitting: Certified Registered Nurse Anesthetist

## 2016-04-18 ENCOUNTER — Inpatient Hospital Stay (HOSPITAL_COMMUNITY): Payer: PPO | Admitting: Emergency Medicine

## 2016-04-18 ENCOUNTER — Encounter (HOSPITAL_COMMUNITY)
Admission: RE | Disposition: A | Discharge status: Home / Self Care | Payer: Self-pay | Source: Ambulatory Visit | Attending: Orthopedic Surgery

## 2016-04-18 DIAGNOSIS — Z91040 Latex allergy status: Secondary | ICD-10-CM

## 2016-04-18 DIAGNOSIS — M19011 Primary osteoarthritis, right shoulder: Secondary | ICD-10-CM | POA: Diagnosis not present

## 2016-04-18 DIAGNOSIS — Z882 Allergy status to sulfonamides status: Secondary | ICD-10-CM | POA: Diagnosis not present

## 2016-04-18 DIAGNOSIS — Z885 Allergy status to narcotic agent status: Secondary | ICD-10-CM | POA: Diagnosis not present

## 2016-04-18 DIAGNOSIS — Z96619 Presence of unspecified artificial shoulder joint: Secondary | ICD-10-CM

## 2016-04-18 DIAGNOSIS — Z471 Aftercare following joint replacement surgery: Secondary | ICD-10-CM | POA: Diagnosis not present

## 2016-04-18 DIAGNOSIS — Z888 Allergy status to other drugs, medicaments and biological substances status: Secondary | ICD-10-CM | POA: Diagnosis not present

## 2016-04-18 DIAGNOSIS — Z96611 Presence of right artificial shoulder joint: Secondary | ICD-10-CM | POA: Diagnosis not present

## 2016-04-18 DIAGNOSIS — G8918 Other acute postprocedural pain: Secondary | ICD-10-CM | POA: Diagnosis not present

## 2016-04-18 HISTORY — PX: TOTAL SHOULDER ARTHROPLASTY: SHX126

## 2016-04-18 HISTORY — DX: Gastro-esophageal reflux disease without esophagitis: K21.9

## 2016-04-18 HISTORY — PX: TOTAL SHOULDER REPLACEMENT: SUR1217

## 2016-04-18 SURGERY — ARTHROPLASTY, SHOULDER, TOTAL
Anesthesia: Regional | Site: Shoulder | Laterality: Right

## 2016-04-18 MED ORDER — DIPHENHYDRAMINE HCL 12.5 MG/5ML PO ELIX: 12.5000 mg | ORAL_SOLUTION | ORAL | Status: DC | PRN

## 2016-04-18 MED ORDER — CEFAZOLIN SODIUM-DEXTROSE 2-4 GM/100ML-% IV SOLN
INTRAVENOUS | Status: AC
  Administered 2016-04-18: 2 g via INTRAVENOUS
  Filled 2016-04-18: qty 100

## 2016-04-18 MED ORDER — LACTATED RINGERS IV SOLN
INTRAVENOUS | Status: DC | PRN
  Administered 2016-04-18 (×2): via INTRAVENOUS

## 2016-04-18 MED ORDER — FENTANYL CITRATE (PF) 250 MCG/5ML IJ SOLN
INTRAMUSCULAR | Status: AC
  Filled 2016-04-18: qty 5

## 2016-04-18 MED ORDER — ATROPINE SULFATE 1 MG/ML IJ SOLN
INTRAMUSCULAR | Status: AC
  Filled 2016-04-18: qty 1

## 2016-04-18 MED ORDER — BUPIVACAINE HCL (PF) 0.5 % IJ SOLN
INTRAMUSCULAR | Status: DC | PRN
  Administered 2016-04-18: 20 mL via PERINEURAL

## 2016-04-18 MED ORDER — PHENYLEPHRINE HCL 10 MG/ML IJ SOLN
10.0000 mg | INTRAVENOUS | Status: DC | PRN
  Administered 2016-04-18: 20 ug/min via INTRAVENOUS

## 2016-04-18 MED ORDER — ASPIRIN EC 325 MG PO TBEC
325.0000 mg | DELAYED_RELEASE_TABLET | Freq: Two times a day (BID) | ORAL | Status: DC
  Administered 2016-04-18 – 2016-04-19 (×3): 325 mg via ORAL
  Filled 2016-04-18 (×3): qty 1

## 2016-04-18 MED ORDER — ONDANSETRON HCL 4 MG/2ML IJ SOLN: 4.0000 mg | Freq: Four times a day (QID) | INTRAMUSCULAR | Status: DC | PRN

## 2016-04-18 MED ORDER — PHENYLEPHRINE HCL 10 MG/ML IJ SOLN
INTRAMUSCULAR | Status: DC | PRN
  Administered 2016-04-18: 120 ug via INTRAVENOUS

## 2016-04-18 MED ORDER — LIDOCAINE HCL (CARDIAC) 20 MG/ML IV SOLN
INTRAVENOUS | Status: DC | PRN
  Administered 2016-04-18: 40 mg via INTRAVENOUS

## 2016-04-18 MED ORDER — ZOLPIDEM TARTRATE 5 MG PO TABS: 5.0000 mg | ORAL_TABLET | Freq: Every evening | ORAL | Status: DC | PRN

## 2016-04-18 MED ORDER — ESTRADIOL-NORETHINDRONE ACET 1-0.5 MG PO TABS
1.0000 | ORAL_TABLET | Freq: Every day | ORAL | Status: DC
  Administered 2016-04-19: 1 via ORAL

## 2016-04-18 MED ORDER — MIDAZOLAM HCL 2 MG/2ML IJ SOLN
INTRAMUSCULAR | Status: AC
  Filled 2016-04-18: qty 2

## 2016-04-18 MED ORDER — DOCUSATE SODIUM 100 MG PO CAPS
100.0000 mg | ORAL_CAPSULE | Freq: Two times a day (BID) | ORAL | Status: DC
  Administered 2016-04-18 – 2016-04-19 (×3): 100 mg via ORAL
  Filled 2016-04-18 (×3): qty 1

## 2016-04-18 MED ORDER — OXYCODONE-ACETAMINOPHEN 5-325 MG PO TABS: 1.0000 | ORAL_TABLET | ORAL | Status: DC | PRN

## 2016-04-18 MED ORDER — POLYETHYLENE GLYCOL 3350 17 G PO PACK: 17.0000 g | PACK | Freq: Every day | ORAL | Status: DC | PRN

## 2016-04-18 MED ORDER — ONDANSETRON HCL 4 MG PO TABS: 4.0000 mg | ORAL_TABLET | Freq: Four times a day (QID) | ORAL | Status: DC | PRN

## 2016-04-18 MED ORDER — OXYCODONE HCL 5 MG PO TABS
5.0000 mg | ORAL_TABLET | ORAL | Status: DC | PRN
  Administered 2016-04-18 – 2016-04-19 (×5): 10 mg via ORAL
  Filled 2016-04-18 (×5): qty 2

## 2016-04-18 MED ORDER — ONDANSETRON HCL 4 MG/2ML IJ SOLN
INTRAMUSCULAR | Status: DC | PRN
  Administered 2016-04-18: 4 mg via INTRAVENOUS

## 2016-04-18 MED ORDER — MENTHOL 3 MG MT LOZG: 1.0000 | LOZENGE | OROMUCOSAL | Status: DC | PRN

## 2016-04-18 MED ORDER — ALUMINUM HYDROXIDE GEL 320 MG/5ML PO SUSP
15.0000 mL | ORAL | Status: DC | PRN
Start: 2016-04-18 — End: 2016-04-19
  Filled 2016-04-18: qty 30

## 2016-04-18 MED ORDER — NIACIN 100 MG PO TABS
100.0000 mg | ORAL_TABLET | Freq: Every day | ORAL | Status: DC
  Administered 2016-04-18: 100 mg via ORAL
  Filled 2016-04-18: qty 1

## 2016-04-18 MED ORDER — ACETAMINOPHEN 500 MG PO TABS
1000.0000 mg | ORAL_TABLET | Freq: Four times a day (QID) | ORAL | Status: AC
  Administered 2016-04-18 – 2016-04-19 (×3): 1000 mg via ORAL
  Filled 2016-04-18 (×4): qty 2

## 2016-04-18 MED ORDER — ESTRADIOL-NORETHINDRONE ACET 1-0.5 MG PO TABS: 1.0000 | ORAL_TABLET | Freq: Every day | ORAL | Status: DC

## 2016-04-18 MED ORDER — METHYLPHENIDATE HCL 5 MG PO TABS
20.0000 mg | ORAL_TABLET | Freq: Three times a day (TID) | ORAL | Status: DC
  Filled 2016-04-18 (×2): qty 4

## 2016-04-18 MED ORDER — PROPOFOL 10 MG/ML IV BOLUS
INTRAVENOUS | Status: DC | PRN
  Administered 2016-04-18: 100 mg via INTRAVENOUS

## 2016-04-18 MED ORDER — METOCLOPRAMIDE HCL 5 MG PO TABS: 5.0000 mg | ORAL_TABLET | Freq: Three times a day (TID) | ORAL | Status: DC | PRN

## 2016-04-18 MED ORDER — ROCURONIUM BROMIDE 50 MG/5ML IV SOLN
INTRAVENOUS | Status: AC
  Filled 2016-04-18: qty 1

## 2016-04-18 MED ORDER — PHENOL 1.4 % MT LIQD
1.0000 | OROMUCOSAL | Status: DC | PRN
Start: 2016-04-18 — End: 2016-04-19

## 2016-04-18 MED ORDER — LIDOCAINE 2% (20 MG/ML) 5 ML SYRINGE
INTRAMUSCULAR | Status: AC
  Filled 2016-04-18: qty 5

## 2016-04-18 MED ORDER — SUCCINYLCHOLINE CHLORIDE 20 MG/ML IJ SOLN
INTRAMUSCULAR | Status: DC | PRN
  Administered 2016-04-18: 80 mg via INTRAVENOUS

## 2016-04-18 MED ORDER — PROPOFOL 10 MG/ML IV BOLUS
INTRAVENOUS | Status: AC
  Filled 2016-04-18: qty 20

## 2016-04-18 MED ORDER — METOCLOPRAMIDE HCL 5 MG/ML IJ SOLN: 5.0000 mg | Freq: Three times a day (TID) | INTRAMUSCULAR | Status: DC | PRN

## 2016-04-18 MED ORDER — CEFAZOLIN SODIUM 1-5 GM-% IV SOLN
1.0000 g | Freq: Four times a day (QID) | INTRAVENOUS | Status: AC
  Administered 2016-04-18 – 2016-04-19 (×3): 1 g via INTRAVENOUS
  Filled 2016-04-18 (×6): qty 50

## 2016-04-18 MED ORDER — SODIUM CHLORIDE 0.9 % IV SOLN
INTRAVENOUS | Status: DC
  Administered 2016-04-18: 13:00:00 via INTRAVENOUS

## 2016-04-18 MED ORDER — MIDAZOLAM HCL 5 MG/5ML IJ SOLN
INTRAMUSCULAR | Status: DC | PRN
  Administered 2016-04-18: 2 mg via INTRAVENOUS

## 2016-04-18 MED ORDER — DOCUSATE SODIUM 100 MG PO CAPS: 100.0000 mg | ORAL_CAPSULE | Freq: Three times a day (TID) | ORAL | Status: AC | PRN

## 2016-04-18 MED ORDER — PHENYLEPHRINE 40 MCG/ML (10ML) SYRINGE FOR IV PUSH (FOR BLOOD PRESSURE SUPPORT)
PREFILLED_SYRINGE | INTRAVENOUS | Status: AC
  Filled 2016-04-18: qty 10

## 2016-04-18 MED ORDER — SUCCINYLCHOLINE CHLORIDE 200 MG/10ML IV SOSY
PREFILLED_SYRINGE | INTRAVENOUS | Status: AC
  Filled 2016-04-18: qty 10

## 2016-04-18 MED ORDER — BISACODYL 10 MG RE SUPP: 10.0000 mg | Freq: Every day | RECTAL | Status: DC | PRN

## 2016-04-18 MED ORDER — CEFAZOLIN SODIUM-DEXTROSE 2-4 GM/100ML-% IV SOLN: 2.0000 g | INTRAVENOUS | Status: DC

## 2016-04-18 MED ORDER — 0.9 % SODIUM CHLORIDE (POUR BTL) OPTIME
TOPICAL | Status: DC | PRN
  Administered 2016-04-18: 1000 mL

## 2016-04-18 MED ORDER — FENTANYL CITRATE (PF) 100 MCG/2ML IJ SOLN
INTRAMUSCULAR | Status: DC | PRN
  Administered 2016-04-18 (×2): 50 ug via INTRAVENOUS

## 2016-04-18 MED ORDER — MORPHINE SULFATE (PF) 2 MG/ML IV SOLN: 2.0000 mg | INTRAVENOUS | Status: DC | PRN

## 2016-04-18 MED ORDER — DEXAMETHASONE SODIUM PHOSPHATE 10 MG/ML IJ SOLN
INTRAMUSCULAR | Status: DC | PRN
  Administered 2016-04-18: 10 mg via INTRAVENOUS

## 2016-04-18 MED ORDER — SODIUM CHLORIDE 0.9 % IR SOLN
Status: DC | PRN
  Administered 2016-04-18: 3000 mL

## 2016-04-18 MED ORDER — TRANEXAMIC ACID 1000 MG/10ML IV SOLN
1000.0000 mg | INTRAVENOUS | Status: AC
  Administered 2016-04-18: 1000 mg via INTRAVENOUS
  Filled 2016-04-18: qty 10

## 2016-04-18 MED ORDER — LIDOCAINE-EPINEPHRINE (PF) 1.5 %-1:200000 IJ SOLN
INTRAMUSCULAR | Status: DC | PRN
  Administered 2016-04-18: 10 mL via PERINEURAL

## 2016-04-18 MED ORDER — EPHEDRINE 5 MG/ML INJ
INTRAVENOUS | Status: AC
  Filled 2016-04-18: qty 10

## 2016-04-18 MED ORDER — POVIDONE-IODINE 7.5 % EX SOLN: Freq: Once | CUTANEOUS | Status: DC

## 2016-04-18 SURGICAL SUPPLY — 80 items
AEQUALIS GUIDE WIRE ×1 IMPLANT
AID PSTN UNV HD RSTRNT DISP (MISCELLANEOUS) ×1
AUGMENT GLENOID REVISION SM RT (Shoulder) IMPLANT
BIT DRILL 5/64X5 DISP (BIT) ×1 IMPLANT
BLADE SAW SAG 73X25 THK (BLADE) ×1
BLADE SAW SGTL 73X25 THK (BLADE) ×1 IMPLANT
BLADE SURG 15 STRL LF DISP TIS (BLADE) ×1 IMPLANT
BLADE SURG 15 STRL SS (BLADE) ×4
CAP SHOULDER TOTAL 1 ×1 IMPLANT
CEMENT BONE DEPUY (Cement) ×1 IMPLANT
CHLORAPREP W/TINT 26ML (MISCELLANEOUS) ×2 IMPLANT
CLSR STERI-STRIP ANTIMIC 1/2X4 (GAUZE/BANDAGES/DRESSINGS) ×1 IMPLANT
COVER SURGICAL LIGHT HANDLE (MISCELLANEOUS) ×2 IMPLANT
DRAPE INCISE IOBAN 66X45 STRL (DRAPES) ×1 IMPLANT
DRAPE ORTHO SPLIT 77X108 STRL (DRAPES) ×4
DRAPE SURG 17X23 STRL (DRAPES) ×2 IMPLANT
DRAPE SURG ORHT 6 SPLT 77X108 (DRAPES) ×2 IMPLANT
DRAPE U-SHAPE 47X51 STRL (DRAPES) ×2 IMPLANT
DRESSING AQUACEL AG SP 3.5X10 (GAUZE/BANDAGES/DRESSINGS) IMPLANT
DRSG AQUACEL AG ADV 3.5X10 (GAUZE/BANDAGES/DRESSINGS) IMPLANT
DRSG AQUACEL AG SP 3.5X10 (GAUZE/BANDAGES/DRESSINGS) ×2
ELECT BLADE 4.0 EZ CLEAN MEGAD (MISCELLANEOUS)
ELECT REM PT RETURN 9FT ADLT (ELECTROSURGICAL) ×2
ELECTRODE BLDE 4.0 EZ CLN MEGD (MISCELLANEOUS) IMPLANT
ELECTRODE REM PT RTRN 9FT ADLT (ELECTROSURGICAL) ×1 IMPLANT
EVACUATOR 1/8 PVC DRAIN (DRAIN) IMPLANT
GLOVE BIO SURGEON STRL SZ7 (GLOVE) ×1 IMPLANT
GLOVE BIO SURGEON STRL SZ7.5 (GLOVE) ×1 IMPLANT
GLOVE BIOGEL PI IND STRL 6.5 (GLOVE) IMPLANT
GLOVE BIOGEL PI IND STRL 7.0 (GLOVE) ×1 IMPLANT
GLOVE BIOGEL PI IND STRL 8 (GLOVE) ×1 IMPLANT
GLOVE BIOGEL PI INDICATOR 6.5 (GLOVE) ×2
GLOVE BIOGEL PI INDICATOR 7.0 (GLOVE) ×2
GLOVE BIOGEL PI INDICATOR 8 (GLOVE) ×1
GLOVE SURG SS PI 7.0 STRL IVOR (GLOVE) ×2 IMPLANT
GLOVE SURG SS PI 7.5 STRL IVOR (GLOVE) ×1 IMPLANT
GLOVE SURG SS PI 8.0 STRL IVOR (GLOVE) ×2 IMPLANT
GOWN STRL REUS W/ TWL LRG LVL3 (GOWN DISPOSABLE) ×1 IMPLANT
GOWN STRL REUS W/ TWL XL LVL3 (GOWN DISPOSABLE) ×1 IMPLANT
GOWN STRL REUS W/TWL LRG LVL3 (GOWN DISPOSABLE) ×4
GOWN STRL REUS W/TWL XL LVL3 (GOWN DISPOSABLE) ×2
HANDPIECE INTERPULSE COAX TIP (DISPOSABLE) ×2
HEMOSTAT SURGICEL 2X14 (HEMOSTASIS) ×2 IMPLANT
HOOD PEEL AWAY FACE SHEILD DIS (HOOD) ×1 IMPLANT
HOOD PEEL AWAY FLYTE STAYCOOL (MISCELLANEOUS) ×4 IMPLANT
KIT BASIN OR (CUSTOM PROCEDURE TRAY) ×2 IMPLANT
KIT ROOM TURNOVER OR (KITS) ×2 IMPLANT
MANIFOLD NEPTUNE II (INSTRUMENTS) ×2 IMPLANT
NDL HYPO 25GX1X1/2 BEV (NEEDLE) IMPLANT
NDL MAYO TROCAR (NEEDLE) ×1 IMPLANT
NEEDLE HYPO 25GX1X1/2 BEV (NEEDLE) IMPLANT
NEEDLE MAYO TROCAR (NEEDLE) ×4 IMPLANT
NS IRRIG 1000ML POUR BTL (IV SOLUTION) ×2 IMPLANT
PACK SHOULDER (CUSTOM PROCEDURE TRAY) ×2 IMPLANT
PAD ARMBOARD 7.5X6 YLW CONV (MISCELLANEOUS) ×3 IMPLANT
RESTRAINT HEAD UNIVERSAL NS (MISCELLANEOUS) ×2 IMPLANT
RETRIEVER SUT HEWSON (MISCELLANEOUS) ×2 IMPLANT
REVISION GLENOID AUGMENT SM RT (Shoulder) ×2 IMPLANT
SET HNDPC FAN SPRY TIP SCT (DISPOSABLE) ×1 IMPLANT
SLING ARM IMMOBILIZER LRG (SOFTGOODS) ×1 IMPLANT
SLING ARM IMMOBILIZER MED (SOFTGOODS) ×1 IMPLANT
SMARTMIX MINI TOWER (MISCELLANEOUS) ×2
SPONGE LAP 18X18 X RAY DECT (DISPOSABLE) ×2 IMPLANT
SPONGE LAP 4X18 X RAY DECT (DISPOSABLE) ×2 IMPLANT
STRIP CLOSURE SKIN 1/2X4 (GAUZE/BANDAGES/DRESSINGS) ×2 IMPLANT
SUCTION FRAZIER HANDLE 10FR (MISCELLANEOUS) ×1
SUCTION TUBE FRAZIER 10FR DISP (MISCELLANEOUS) ×1 IMPLANT
SUPPORT WRAP ARM LG (MISCELLANEOUS) ×2 IMPLANT
SUT ETHIBOND NAB CT1 #1 30IN (SUTURE) ×6 IMPLANT
SUT MNCRL AB 4-0 PS2 18 (SUTURE) ×2 IMPLANT
SUT SILK 2 0 TIES 17X18 (SUTURE) ×2
SUT SILK 2-0 18XBRD TIE BLK (SUTURE) IMPLANT
SUT VIC AB 2-0 CT1 27 (SUTURE) ×4
SUT VIC AB 2-0 CT1 TAPERPNT 27 (SUTURE) ×1 IMPLANT
SYR CONTROL 10ML LL (SYRINGE) ×1 IMPLANT
TAPE LABRALWHITE 1.5X36 (TAPE) ×4 IMPLANT
TAPE SUT LABRALTAP WHT/BLK (SUTURE) ×2 IMPLANT
TOWEL OR 17X24 6PK STRL BLUE (TOWEL DISPOSABLE) ×2 IMPLANT
TOWEL OR 17X26 10 PK STRL BLUE (TOWEL DISPOSABLE) ×2 IMPLANT
TOWER SMARTMIX MINI (MISCELLANEOUS) ×1 IMPLANT

## 2016-04-18 NOTE — H&P (Signed)
Stephanie Savage is an 65 y.o. female.   Chief Complaint: R shoulder pain and dysfunction. HPI: R shoulder endstage bone on bone osteoarthritis with posterior subluxation. Failed conservative management.  Past Medical History  Diagnosis Date  . Anemia   . Arthritis   . Allergy   . Rectocele   . Complication of anesthesia     "hypes me up"  . Anxiety     Past Surgical History  Procedure Laterality Date  . Rotator cuff repair Right 07/22/2013    bone spurs; involved muscle  . Shoulder Left [id number]  . Tonsillectomy    . Colonoscopy      Family History  Problem Relation Age of Onset  . Colon cancer Neg Hx   . Colon polyps Neg Hx    Social History:  reports that she has never smoked. She has never used smokeless tobacco. She reports that she drinks alcohol. She reports that she does not use illicit drugs.  Allergies:  Allergies  Allergen Reactions  . Codeine Nausea Only  . Latex Itching    Red skin  . Sulfa Antibiotics Hives    Medications Prior to Admission  Medication Sig Dispense Refill  . Biotin (BIOTIN 5000) 5 MG CAPS Take by mouth.    . calcium carbonate (OS-CAL - DOSED IN MG OF ELEMENTAL CALCIUM) 1250 (500 Ca) MG tablet Take 1 tablet by mouth 2 (two) times daily with a meal.    . celecoxib (CELEBREX) 200 MG capsule TAKE 1 CAPSULE BY MOUTH ONCE DAILY AS NEEDED  6  . cholecalciferol (VITAMIN D) 1000 UNITS tablet Take 2,000 Units by mouth daily.     . diclofenac sodium (VOLTAREN) 1 % GEL Apply topically 2 (two) times daily.    Marland Kitchen estradiol-norethindrone (ACTIVELLA) 1-0.5 MG per tablet Take 1 tablet by mouth daily.    . fexofenadine (ALLEGRA) 180 MG tablet Take 180 mg by mouth daily.    . fish oil-omega-3 fatty acids 1000 MG capsule Take 2 g by mouth daily.    . fluticasone (FLONASE) 50 MCG/ACT nasal spray Place 1 spray into both nostrils daily.    . methylphenidate (RITALIN) 20 MG tablet Take 20 mg by mouth 3 (three) times daily with meals.     . Multiple Vitamin  (MULTIVITAMIN) capsule Take 1 capsule by mouth daily.    . niacin 100 MG tablet Take 100 mg by mouth at bedtime.    . Probiotic Product (PROBIOTIC DAILY PO) Take 1 tablet by mouth daily.     Marland Kitchen pyridOXINE (VITAMIN B-6) 100 MG tablet Take 100 mg by mouth daily.      No results found for this or any previous visit (from the past 48 hour(s)). No results found.  Review of Systems  All other systems reviewed and are negative.   Blood pressure 161/93, pulse 73, temperature 98 F (36.7 C), temperature source Oral, resp. rate 18, weight 51.256 kg (113 lb), SpO2 100 %. Physical Exam  Constitutional: She is oriented to person, place, and time. She appears well-developed and well-nourished.  HENT:  Head: Atraumatic.  Eyes: EOM are normal.  Cardiovascular: Intact distal pulses.   Respiratory: Effort normal.  Musculoskeletal:  R shoulder pain with limited ROM  Neurological: She is alert and oriented to person, place, and time.  Skin: Skin is warm and dry.  Psychiatric: She has a normal mood and affect.     Assessment/Plan R endstage bone on bone osteoarthritis Plan R total shoulder replacement. Risks / benefits of surgery  discussed Consent on chart  NPO for OR Preop antibiotics   Mable ParisHANDLER,JUSTIN WILLIAM, MD 04/18/2016, 7:13 AM

## 2016-04-18 NOTE — Transfer of Care (Signed)
Immediate Anesthesia Transfer of Care Note  Patient: Stephanie EvertsBarbara A Savage  Procedure(s) Performed: Procedure(s) with comments: TOTAL SHOULDER ARTHROPLASTY (Right) - Right total shoulder arthroplasty  Patient Location: PACU  Anesthesia Type:General and Regional  Level of Consciousness: awake, alert , oriented and patient cooperative  Airway & Oxygen Therapy: Patient Spontanous Breathing and Patient connected to nasal cannula oxygen  Post-op Assessment: Report given to RN and Post -op Vital signs reviewed and stable  Post vital signs: Reviewed and stable  Last Vitals:  Filed Vitals:   04/18/16 0557  BP: 161/93  Pulse: 73  Temp: 36.7 C  Resp: 18    Last Pain: There were no vitals filed for this visit.    Patients Stated Pain Goal: 3 (04/18/16 0555)  Complications: No apparent anesthesia complications

## 2016-04-18 NOTE — Anesthesia Preprocedure Evaluation (Signed)
Anesthesia Evaluation  Patient identified by MRN, date of birth, ID band Patient awake    Reviewed: Allergy & Precautions, NPO status , Patient's Chart, lab work & pertinent test results  Airway Mallampati: II  TM Distance: >3 FB Neck ROM: Full    Dental no notable dental hx.    Pulmonary neg pulmonary ROS,    Pulmonary exam normal breath sounds clear to auscultation       Cardiovascular negative cardio ROS Normal cardiovascular exam Rhythm:Regular Rate:Normal     Neuro/Psych negative neurological ROS  negative psych ROS   GI/Hepatic negative GI ROS, Neg liver ROS,   Endo/Other  negative endocrine ROS  Renal/GU negative Renal ROS  negative genitourinary   Musculoskeletal negative musculoskeletal ROS (+)   Abdominal   Peds negative pediatric ROS (+)  Hematology negative hematology ROS (+)   Anesthesia Other Findings   Reproductive/Obstetrics negative OB ROS                             Anesthesia Physical Anesthesia Plan  ASA: I  Anesthesia Plan: General   Post-op Pain Management: GA combined w/ Regional for post-op pain   Induction: Intravenous  Airway Management Planned: Oral ETT  Additional Equipment:   Intra-op Plan:   Post-operative Plan: Extubation in OR  Informed Consent: I have reviewed the patients History and Physical, chart, labs and discussed the procedure including the risks, benefits and alternatives for the proposed anesthesia with the patient or authorized representative who has indicated his/her understanding and acceptance.   Dental advisory given  Plan Discussed with: CRNA and Surgeon  Anesthesia Plan Comments:         Anesthesia Quick Evaluation  

## 2016-04-18 NOTE — Progress Notes (Signed)
Utilization review completed.  

## 2016-04-18 NOTE — Op Note (Signed)
Procedure(s): TOTAL SHOULDER ARTHROPLASTY Procedure Note  Stephanie EvertsBarbara A Savage female 65 y.o. 04/18/2016  Procedure(s) and Anesthesia Type:    * Right TOTAL SHOULDER ARTHROPLASTY - Choice  Surgeon(s) and Role:    * Jones BroomJustin Chandler, MD - Primary   Indications:  65 y.o. female  With endstage right shoulder arthritis. Pain and dysfunction interfered with quality of life and nonoperative treatment with activity modification, NSAIDS and injections failed.     Surgeon: Mable ParisHANDLER,JUSTIN WILLIAM   Assistants: Damita Lackanielle Lalibert PA-C [facility](Danielle was present and scrubbed throughout the procedure and was essential in positioning, retraction, exposure, and closure)  Anesthesia: General endotracheal anesthesia with preoperative interscalene block given by the attending anesthesiologist    Procedure Detail  TOTAL SHOULDER ARTHROPLASTY  Findings: Tornier flex anatomic press-fit size 3 stem with a 46 high offset head, cemented size small 35 performed plus Cortiloc glenoid. A lesser tuberosity osteotomy was performed and repaired at the conclusion of the procedure.  Estimated Blood Loss:  200 mL         Drains: None   Blood Given: none          Specimens: none        Complications:  * No complications entered in OR log *         Disposition: PACU - hemodynamically stable.         Condition: stable    Procedure:   The patient was identified in the preoperative holding area where I personally marked the operative extremity after verifying with the patient and consent. She  was taken to the operating room where She was transferred to the   operative table.  The patient received an interscalene block in   the holding area by the attending anesthesiologist.  General anesthesia was induced   in the operating room without complication.  The patient did receive IV  Ancef prior to the commencement of the procedure.  The patient was   placed in the beach-chair position with the back raised about  30   degrees.  The nonoperative extremity and head and neck were carefully   positioned and padded protecting against neurovascular compromise.  The   left upper extremity was then prepped and draped in the standard sterile   fashion.    The appropriate operative time-out was performed with   Anesthesia, the perioperative staff, as well as myself and we all agreed   that the right side was the correct operative site.  An approximately   10 cm incision was made from the tip of the coracoid to the center point of the   humerus at the level of the axilla.  Dissection was carried down sharply   through subcutaneous tissues and cephalic vein was identified and taken   laterally with the deltoid.  The pectoralis major was taken medially.  The   upper 1 cm of the pectoralis major was released from its attachment on   the humerus.  The clavipectoral fascia was incised just lateral to the   conjoined tendon.  This incision was carried up to but not into the   coracoacromial ligament.  Digital palpation was used to prove   integrity of the axillary nerve which was protected throughout the   procedure.  Musculocutaneous nerve was not palpated in the operative   field.  Conjoined tendon was then retracted gently medially and the   deltoid laterally.  Anterior circumflex humeral vessels were clamped and   coagulated.  The soft tissues overlying the biceps  was incised and this   incision was carried across the transverse humeral ligament to the base   of the coracoid.  The biceps was tenodesed to the soft tissue just above   pectoralis major and the remaining portion of the biceps superiorly was   excised.  An osteotomy was performed at the lesser tuberosity.  Capsule was then   released all the way down to the 6 o'clock position of the humeral head.   The humeral head was then delivered with simultaneous adduction,   extension and external rotation.  All humeral osteophytes were removed   and the  anatomic neck of the humerus was marked and cut free hand at   approximately 25 degrees retroversion within about 3 mm of the cuff   reflection posteriorly.  The head size was estimated to be a 46 medium   offset.  At that point, the humeral head was retracted posteriorly with   a Fukuda retractor.   Remaining portion of the capsule was released at the base of the   coracoid.  The remaining biceps anchor and the entire anterior-inferior   labrum was excised.  The posterior labrum was also excised but the   posterior capsule was not released.  The guidepin was placed bicortically with the perform plus elevated guide.  The reamer was used to ream to concentric bone with punctate bleeding anteriorly creating a tide line just posterior to the pin.  The anterior guide hole was drilled and then the posterior angled reamer was used sequentially from 15-35 obtaining good bony contact with the 35. Minimal reaming was required. The peripheral holes were then drilled.  The center hole was then drilled for an anchor peg glenoid  and none of the holes   exited the glenoid wall. The trial with a 35 elevated posterior augment was placed and had excellent fit. I then pulse irrigated these holes and dried   them with Surgicel.  The three peripheral holes were then   pressurized cemented and the anchor peg glenoid was placed and impacted   with an excellent fit.  The glenoid was a small 35 perform plus component.  The proximal humerus was then again exposed taking care not to displace the glenoid.    The entry awl was used followed by sounding reamers and then sequentially broached from size 1 to 3. This was then left in place and the calcar planer was used. Trial head was placed with a 46.  With the trial implantation of the component, there was approximately 50% posterior translation with immediate snap back to the   anatomic position.  With forward elevation, there was no tendency   towards posterior subluxation.    The trial was removed and the final implant was prepared on a back table.  The trial was removed and the final implant was prepared on a back table.   3 small holes were drilled on the medial side of the lesser tuberosity osteotomy, through which 2 labral tapes were passed. The implant was then placed through the loop of the 2 labral tapes and impacted with an excellent press-fit. This achieved excellent anatomic reconstruction of the proximal humerus.  The joint was then copiously irrigated with pulse lavage.  The subscapularis and   lesser tuberosity osteotomy were then repaired using the 2 labral tapes previously passed in a double row fashion with horizontal mattress sutures medially brought over through bone tunnels tied over a bone bridge laterally.   One #1 Ethibond was  placed at the rotator interval just above   the lesser tuberosity. Copious irrigation was used. Skin was closed with 2-0 Vicryl sutures in the deep dermal layer and 4-0 Monocryl in a subcuticular  running fashion.  Sterile dressings were then applied including Aquacel.  The patient was placed in a sling and allowed to awaken from general anesthesia and taken to the recovery room in stable condition.      POSTOPERATIVE PLAN:  Early passive range of motion will be allowed with the goal of 0 degrees external rotation and 90 degrees forward elevation.  No internal rotation at this time.  No active motion of the arm until the lesser tuberosity heals.  The patient will likely be kept in the hospital for 1-2 days and then discharged home.

## 2016-04-18 NOTE — Anesthesia Procedure Notes (Addendum)
Anesthesia Regional Block:  Interscalene brachial plexus block  Pre-Anesthetic Checklist: ,, timeout performed, Correct Patient, Correct Site, Correct Laterality, Correct Procedure, Correct Position, site marked, Risks and benefits discussed,  Surgical consent,  Pre-op evaluation,  At surgeon's request and post-op pain management  Laterality: Right  Prep: chloraprep       Needles:  Injection technique: Single-shot  Needle Type: Echogenic Needle     Needle Length: 9cm 9 cm Needle Gauge: 21 and 21 G    Additional Needles:  Procedures: ultrasound guided (picture in chart) Interscalene brachial plexus block Narrative:  Injection made incrementally with aspirations every 5 mL.  Performed by: Personally  Anesthesiologist: ROSE, Greggory StallionGEORGE  Additional Notes: Patient tolerated the procedure well without complications   Procedure Name: Intubation Date/Time: 04/18/2016 7:38 AM Performed by: Adonis HousekeeperNGELL, JANNA M Pre-anesthesia Checklist: Patient identified, Emergency Drugs available, Suction available and Patient being monitored Patient Re-evaluated:Patient Re-evaluated prior to inductionOxygen Delivery Method: Circle system utilized Preoxygenation: Pre-oxygenation with 100% oxygen Intubation Type: IV induction Ventilation: Mask ventilation without difficulty Laryngoscope Size: Mac and 3 Grade View: Grade II Tube type: Oral Tube size: 7.0 mm Number of attempts: 1 Airway Equipment and Method: Stylet Placement Confirmation: ETT inserted through vocal cords under direct vision,  positive ETCO2 and breath sounds checked- equal and bilateral Secured at: 22 cm Tube secured with: Tape Dental Injury: Teeth and Oropharynx as per pre-operative assessment

## 2016-04-18 NOTE — Anesthesia Postprocedure Evaluation (Signed)
Anesthesia Post Note  Patient: Stephanie EvertsBarbara A Savage  Procedure(s) Performed: Procedure(s) (LRB): TOTAL SHOULDER ARTHROPLASTY (Right)  Patient location during evaluation: PACU Anesthesia Type: General and Regional Level of consciousness: awake and alert Pain management: pain level controlled Vital Signs Assessment: post-procedure vital signs reviewed and stable Respiratory status: spontaneous breathing, nonlabored ventilation, respiratory function stable and patient connected to nasal cannula oxygen Cardiovascular status: blood pressure returned to baseline and stable Postop Assessment: no signs of nausea or vomiting Anesthetic complications: no    Last Vitals:  Filed Vitals:   04/18/16 1000 04/18/16 1015  BP:    Pulse: 81 70  Temp:    Resp: 12 16    Last Pain: There were no vitals filed for this visit.               ROSE,GEORGE S

## 2016-04-18 NOTE — Discharge Instructions (Signed)

## 2016-04-19 LAB — CBC
HCT: 35.6 % — ABNORMAL LOW (ref 36.0–46.0)
Hemoglobin: 11.9 g/dL — ABNORMAL LOW (ref 12.0–15.0)
MCH: 30.8 pg (ref 26.0–34.0)
MCHC: 33.4 g/dL (ref 30.0–36.0)
MCV: 92.2 fL (ref 78.0–100.0)
Platelets: 216 10*3/uL (ref 150–400)
RBC: 3.86 MIL/uL — ABNORMAL LOW (ref 3.87–5.11)
RDW: 12.6 % (ref 11.5–15.5)
WBC: 9.8 10*3/uL (ref 4.0–10.5)

## 2016-04-19 NOTE — Progress Notes (Signed)
Occupational Therapy Evaluation and Treatment:  Pt seen for 2 visits. Completed family/pt education regarding compensatory techniques and HEP for RUE (PROM 0-90FF and ER to neutral). Second visit to review information and complete educaiton with husband.   04/19/16 0900  OT Visit Information  Last OT Received On 04/19/16  Assistance Needed +1  History of Present Illness s/p R shoulder TSA  Precautions  Precautions Shoulder  Type of Shoulder Precautions passive; FF 0-90; ER to neutral; no abduction; elbow/wrist and hand AROM  Shoulder Interventions At all times;Shoulder sling/immobilizer  Precaution Booklet Issued Yes (comment)  Required Braces or Orthoses Sling  Restrictions  Weight Bearing Restrictions Yes (NWB RUE)  Home Living  Family/patient expects to be discharged to: Private residence  Living Arrangements Spouse/significant other  Available Help at Discharge Family;Available PRN/intermittently  Type of Home House  Home Access Stairs to enter  Entrance Stairs-Number of Steps 8  Entrance Stairs-Rails Right;Left;Can reach both  Home Layout Bed/bath upstairs;Multi-level  Bathroom Shower/Tub Programmer, multimediaTub/shower unit  Bathroom Toilet Standard  Bathroom Accessibility Yes  How Accessible Accessible via walker  Home Equipment None  Prior Function  Level of Independence Independent  Communication  Communication No difficulties  Pain Assessment  Pain Assessment 0-10  Pain Score 8  Pain Location R shoulder  Pain Descriptors / Indicators Aching  Pain Intervention(s) Limited activity within patient's tolerance;Ice applied;Repositioned  Manufacturing engineerCognition  Arousal/Alertness Lethargic;Suspect due to medications  Behavior [facility] for tasks assessed/performed  Overall Cognitive Status Within Functional Limits for tasks assessed  Upper Extremity Assessment  Upper Extremity Assessment RUE deficits/detail  RUE Deficits / Details s/p TSA  Lower Extremity Assessment  Lower Extremity  Assessment Overall WFL for tasks assessed  Cervical / Trunk Assessment  Cervical / Trunk Assessment Normal  ADL  Overall ADL's  Needs assistance/impaired  Eating/Feeding Set up  Grooming Minimal assistance  Upper Body Bathing Minimal assitance  Lower Body Bathing Set up  Upper Body Dressing  Minimal assistance  Lower Body Dressing Set up  Toilet Transfer Min guard  General ADL Comments Began education regarding compensaory techniques for ADL  Bed Mobility  Overal bed mobility Modified Independent  Transfers  Overall transfer level Modified independent  Exercises  Exercises Shoulder  Shoulder Instructions  Donning/doffing shirt without moving shoulder Minimal assistance  Method for sponge bathing under operated UE Minimal assistance  Donning/doffing sling/immobilizer Minimal assistance  Correct positioning of sling/immobilizer Minimal assistance  ROM for elbow, wrist and digits of operated UE Supervision/safety  Sling wearing schedule (on at all times/off for ADL's) Supervision/safety  Proper positioning of operated UE when showering Supervision/safety  Positioning of UE while sleeping Supervision/safety  Shoulder Exercises  Elbow Flexion AROM;Right;10 reps;Supine  Elbow Extension AROM;Right;10 reps  Wrist Flexion AROM;Right;10 reps  Wrist Extension AROM;Right;10 reps  Digit Composite Flexion AROM;Right;10 reps  Composite Extension AROM;Right;10 reps  OT - End of Session  Activity Tolerance Patient tolerated treatment well  Patient left in bed;with call bell/phone within reach  Nurse Communication Mobility status  OT Assessment  OT Therapy Diagnosis  Generalized weakness;Acute pain  OT Recommendation/Assessment Patient needs continued OT Services  OT Problem List Decreased strength;Decreased range of motion;Decreased knowledge of precautions;Impaired UE functional use;Pain  OT Plan  OT Frequency (ACUTE ONLY) Min 2X/week  OT Treatment/Interventions (ACUTE ONLY)  Self-care/ADL training;Therapeutic exercise;Therapeutic activities;Patient/family education  OT Recommendation  Follow Up Recommendations Supervision - Intermittent;Other (comment) (follow up with Dr. Ave Filterhandler to progress rehab of R shoudler)  OT Equipment None recommended by OT  Individuals Consulted  Consulted and Agree with Results and Recommendations Patient  Acute Rehab OT Goals  Patient Stated Goal to go home today  OT Goal Formulation With patient  Time For Goal Achievement 04/26/16  Potential to Achieve Goals Good  OT Time Calculation  OT Start Time (ACUTE ONLY) 0932 1053 (2nd visit)  OT Stop Time (ACUTE ONLY) 0959 1125(2nd visit)  OT Time Calculation (min) 27 min 32 min (2nd visit)  OT General Charges  $OT Visit (2 visits)  OT Evaluation  $OT Eval Moderate Complexity 1 Procedure  OT Treatments  $Self Care/Home Management  23-37 mins  $Therapeutic Exercise 8-22 mins  Written Expression  Dominant Hand Right  [facility], OTR/L  [phone] 04/19/2016

## 2016-04-19 NOTE — Progress Notes (Signed)
Ritalin 20 mg wasted in sharps container after patient discharged.  Unable to document waste in Pyxis.  Waste witnessed by Press photographercharge nurse, Ruthell RummageMary-Jordan Johnson,RN.

## 2016-04-19 NOTE — Discharge Summary (Signed)
Patient ID: Stephanie Savage MRN: [id number] DOB/AGE: 01/27/1951 65 y.o.  Admit date: 04/18/2016 Discharge date: 04/19/2016  Admission Diagnoses:  Active Problems:   Status post total shoulder arthroplasty   Discharge Diagnoses:  Same  Past Medical History  Diagnosis Date  . Anemia   . Arthritis   . Allergy   . Rectocele   . Complication of anesthesia     "hypes me up"  . Anxiety   . GERD (gastroesophageal reflux disease)     Surgeries: Procedure(s): TOTAL SHOULDER ARTHROPLASTY on 04/18/2016   Consultants:    Discharged Condition: Improved  Hospital Course: Stephanie Savage is an 65 y.o. female who was admitted 04/18/2016 for operative treatment of end stage right shoulder osteoarthritis. Patient has severe unremitting pain that affects sleep, daily activities, and work/hobbies. After pre-op clearance the patient was taken to the operating room on 04/18/2016 and underwent  Procedure(s): TOTAL SHOULDER ARTHROPLASTY.    Patient was given perioperative antibiotics: Anti-infectives    Start     Dose/Rate Route Frequency Ordered Stop   04/18/16 1500  ceFAZolin (ANCEF) IVPB 1 g/50 mL premix     1 g 100 mL/hr over 30 Minutes Intravenous Every 6 hours 04/18/16 1415 04/19/16 0415   04/18/16 0543  ceFAZolin (ANCEF) IVPB 2g/100 mL premix  Status:  Discontinued     2 g 200 mL/hr over 30 Minutes Intravenous On call to O.R. 04/18/16 0543 04/18/16 1409   04/18/16 0528  ceFAZolin (ANCEF) 2-4 GM/100ML-% IVPB    Comments:  Scronce, Trina   : cabinet override      04/18/16 0528 04/18/16 0742       Patient was given sequential compression devices, early ambulation, and ASA 325mg  BID to prevent DVT.  Patient benefited maximally from hospital stay and there were no complications.    Recent vital signs: Patient Vitals for the past 24 hrs:  BP Temp Temp src Pulse Resp SpO2 Height Weight  04/19/16 0551 113/68 mmHg 98.3 F (36.8 C) Oral 64 16 100 % - -  04/19/16 0135 119/65 mmHg 98.2 F  (36.8 C) Oral 71 16 99 % - -  04/18/16 2026 132/64 mmHg 98.3 F (36.8 C) Oral 71 16 100 % - -  04/18/16 1416 (!) 144/84 mmHg 97.9 F (36.6 C) - 76 16 99 % 5\' 4"  (1.626 m) 52.436 kg (115 lb 9.6 oz)  04/18/16 1343 132/84 mmHg 98.5 F (36.9 C) - 73 16 100 % - -  04/18/16 1125 (!) 153/98 mmHg - - 74 16 100 % - -  04/18/16 1110 (!) 133/120 mmHg - - 67 13 100 % - -  04/18/16 1100 (!) 150/80 mmHg - - 63 13 100 % - -  04/18/16 1040 (!) 162/87 mmHg - - 64 12 100 % - -  04/18/16 1025 (!) 159/94 mmHg - - 65 13 100 % - -  04/18/16 1015 - - - 70 16 100 % - -  04/18/16 1010 (!) 152/90 mmHg - - 80 15 100 % - -  04/18/16 1000 - - - 81 12 100 % - -  04/18/16 0955 (!) 142/103 mmHg 97.5 F (36.4 C) - 81 13 100 % - -     Recent laboratory studies:  Recent Labs  04/19/16 0655  WBC 9.8  HGB 11.9*  HCT 35.6*  PLT 216     Discharge Medications:     Medication List    STOP taking these medications        celecoxib  200 MG capsule  Commonly known as:  CELEBREX      TAKE these medications        BIOTIN 5000 5 MG Caps  Generic drug:  Biotin  Take by mouth.     calcium carbonate 1250 (500 Ca) MG tablet  Commonly known as:  OS-CAL - dosed in mg of elemental calcium  Take 1 tablet by mouth 2 (two) times daily with a meal.     cholecalciferol 1000 units tablet  Commonly known as:  VITAMIN D  Take 2,000 Units by mouth daily.     diclofenac sodium 1 % Gel  Commonly known as:  VOLTAREN  Apply topically 2 (two) times daily.     docusate sodium 100 MG capsule  Commonly known as:  COLACE  Take 1 capsule (100 mg total) by mouth 3 (three) times daily as needed.     estradiol-norethindrone 1-0.5 MG tablet  Commonly known as:  ACTIVELLA  Take 1 tablet by mouth daily.     fexofenadine 180 MG tablet  Commonly known as:  ALLEGRA  Take 180 mg by mouth daily.     fish oil-omega-3 fatty acids 1000 MG capsule  Take 2 g by mouth daily.     fluticasone 50 MCG/ACT nasal spray  Commonly known  as:  FLONASE  Place 1 spray into both nostrils daily.     methylphenidate 20 MG tablet  Commonly known as:  RITALIN  Take 20 mg by mouth 3 (three) times daily with meals.     multivitamin capsule  Take 1 capsule by mouth daily.     niacin 100 MG tablet  Take 100 mg by mouth at bedtime.     oxyCODONE-acetaminophen 5-325 MG tablet  Commonly known as:  ROXICET  Take 1-2 tablets by mouth every 4 (four) hours as needed for severe pain.     PROBIOTIC DAILY PO  Take 1 tablet by mouth daily.     pyridOXINE 100 MG tablet  Commonly known as:  VITAMIN B-6  Take 100 mg by mouth daily.        Diagnostic Studies: Dg Chest 2 View  04/12/2016  CLINICAL DATA:  Congestion . EXAM: CHEST  2 VIEW COMPARISON:  CT 04/04/2016. FINDINGS: Mediastinum and hilar structures are normal. Stable apical nodular pleural thickening noted consistent with scarring . Lungs are clear. Nodular density noted left lung base consistent granuloma. Thoracic spine scoliosis and degenerative change. Interposition of the colon under the hemidiaphragms noted. IMPRESSION: No acute cardiopulmonary disease. Electronically Signed   By: Maisie Fus  Register   On: 04/12/2016 12:25   Ct Shoulder Right Wo Contrast  04/04/2016  CLINICAL DATA:  Chronic right shoulder pain. Osteoarthritis. Patient for shoulder replacement. No known injury. Preoperative examination. Subsequent encounter. EXAM: CT OF THE RIGHT SHOULDER WITHOUT CONTRAST TECHNIQUE: Multidetector CT imaging was performed according to the standard protocol. Multiplanar CT image reconstructions were also generated. COMPARISON:  MRI right shoulder this same day and 07/10/2013. FINDINGS: The patient has advanced glenohumeral osteoarthritis. There is bone-on-bone joint space narrowing and subchondral sclerosis. The humeral head shows mild posterior subluxation on the glenoid and the posterior glenoid is sclerotic and mildly remodeled. Very small subchondral cysts are seen in the posterior,  inferior glenoid and there is a tiny subchondral cyst in the superior glenoid. There is no fracture. The patient is status post acromioplasty and debridement of the acromioclavicular joint. Imaged musculature appears intact. A small volume of fluid is seen in the subacromial/subdeltoid bursa. There is also  glenohumeral joint effusion. Imaged lung parenchyma is clear. IMPRESSION: Advanced glenohumeral osteoarthritis as detailed above. Status post acromioplasty and debridement of the acromioclavicular joint without evidence of complication. Electronically Signed   By: Drusilla Kannerhomas  Dalessio M.D.   On: 04/04/2016 15:17   Mr Shoulder Right Wo Contrast  04/04/2016  CLINICAL DATA:  Chronic right shoulder pain. Patient for shoulder replacement. No known injury. Subsequent encounter. EXAM: MRI OF THE RIGHT SHOULDER WITHOUT CONTRAST TECHNIQUE: Multiplanar, multisequence MR imaging of the shoulder was performed. No intravenous contrast was administered. COMPARISON:  MRI right shoulder 07/10/2013. FINDINGS: Rotator cuff: There is mild appearing rotator cuff tendinopathy without tear. Muscles:  Normal without atrophy or focal lesion. Biceps long head:  Intact. Acromioclavicular Joint: The joint has been resected without evidence of complication. Glenohumeral Joint: The patient has severe glenohumeral degenerative change. Subchondral edema about the joint is worse in the glenoid. The humeral head shows mild posterior subluxation. Osteophyte off the humeral head is identified. Small subchondral cysts are seen in the posterior, inferior glenoid. Small glenohumeral joint effusion contains debris. Labrum: Diffusely degenerated and torn. Paralabral cyst in the spinoglenoid notch on the prior examination is no longer seen. Extensive paralabral cyst formation is seen along the posterior glenoid rim. Largest component of the cyst is at the 7 o'clock position and measures 1 cm AP by 1.3 cm transverse by approximately 1.5 cm craniocaudal.  Bones: No fracture or worrisome marrow lesion. The patient is status post acromioplasty. There is some fluid in the subacromial/subdeltoid bursa. IMPRESSION: Mild appearing rotator cuff tendinopathy without tear. Advanced glenohumeral osteoarthritis with associated labral degeneration and tearing has worsened since the prior MRI. Posterior subluxation of the humeral head on the glenoid and small joint effusion containing debris are noted. Status post acromioplasty and resection of the acromioclavicular joint without complicating feature. Subacromial/subdeltoid fluid compatible with bursitis. Electronically Signed   By: Drusilla Kannerhomas  Dalessio M.D.   On: 04/04/2016 15:10   Dg Shoulder Right Port  04/18/2016  CLINICAL DATA:  S/P shoulder replacement, right EXAM: PORTABLE RIGHT SHOULDER - 2+ VIEW COMPARISON:  04/12/2016 FINDINGS: Patient has undergone right shoulder arthroplasty. The humeral head component is normally positioned over the glenoid fossa on the frontal view provided. Postoperative gas is identified in the shoulder joint. No adverse features identified. IMPRESSION: Status post right shoulder arthroplasty. Electronically Signed   By: Norva PavlovElizabeth  Brown M.D.   On: 04/18/2016 10:24    Disposition: Final discharge disposition not confirmed      Discharge Instructions    Call MD / Call 911    Complete by:  As directed   If you experience chest pain or shortness of breath, CALL 911 and be transported to the hospital emergency room.  If you develope a fever above 101 F, pus (white drainage) or increased drainage or redness at the wound, or calf pain, call your surgeon's office.     Constipation Prevention    Complete by:  As directed   Drink plenty of fluids.  Prune juice may be helpful.  You may use a stool softener, such as Colace (over the counter) 100 mg twice a day.  Use MiraLax (over the counter) for constipation as needed.     Diet - low sodium heart healthy    Complete by:  As directed       Increase activity slowly as tolerated    Complete by:  As directed            Follow-up Information    Follow up  with Mable Paris, MD. Schedule an appointment as soon as possible for a visit in 2 weeks.   Specialty:  Orthopedic Surgery   Contact information:   [address] [place] [address] [phone]        Signed: Jiles Harold 04/19/2016, 8:12 AM

## 2016-04-19 NOTE — Progress Notes (Signed)
Discharge Note:  Patient alert and oriented X 4 and in no distress. Patient given discharge instructions regarding signs and symptoms to report, medications, activity, diet and upcoming appointments.  Patient verbalized understanding of all instructions.  Peripheral IV discontinued.  Patient confirmed that she had all of her personal belongings.  Patient transported out via wheelchair by hospital volunteer.

## 2016-04-19 NOTE — Progress Notes (Signed)
   PATIENT ID: Stephanie EvertsBarbara A Savage   1 Day Post-Op Procedure(s) (LRB): TOTAL SHOULDER ARTHROPLASTY (Right)  Subjective: Doing well. Block wore off last night with increased pain, but now well controlled. Comfortable and ready to go home today.   Objective:  Filed Vitals:   04/19/16 0135 04/19/16 0551  BP: 119/65 113/68  Pulse: 71 64  Temp: 98.2 F (36.8 C) 98.3 F (36.8 C)  Resp: 16 16     R UE dressing c/d/i Wiggles fingers, distally NVI   Labs:   Recent Labs  04/19/16 0655  HGB 11.9*   Recent Labs  04/19/16 0655  WBC 9.8  RBC 3.86*  HCT 35.6*  PLT 216  No results for input(s): NA, K, CL, CO2, BUN, CREATININE, GLUCOSE, CALCIUM in the last 72 hours.  Assessment and Plan: 1 day s/p right TSA OT- PROM limited to 90 FF and 0 ER D/c home after cleared by PT Scripts in chart Fu with Dr. Ave Filterhandler in 2 weeks   VTE proph: ASA 325mg  BID

## 2016-04-22 ENCOUNTER — Encounter (HOSPITAL_COMMUNITY): Payer: Self-pay | Admitting: Orthopedic Surgery

## 2016-04-23 ENCOUNTER — Encounter (HOSPITAL_COMMUNITY): Payer: Self-pay | Admitting: Orthopedic Surgery

## 2016-04-25 ENCOUNTER — Encounter (HOSPITAL_COMMUNITY): Payer: Self-pay | Admitting: Orthopedic Surgery

## 2016-05-01 DIAGNOSIS — M19011 Primary osteoarthritis, right shoulder: Secondary | ICD-10-CM | POA: Diagnosis not present

## 2016-05-29 DIAGNOSIS — Z471 Aftercare following joint replacement surgery: Secondary | ICD-10-CM | POA: Diagnosis not present

## 2016-05-29 DIAGNOSIS — Z96611 Presence of right artificial shoulder joint: Secondary | ICD-10-CM | POA: Diagnosis not present

## 2016-06-03 DIAGNOSIS — M25511 Pain in right shoulder: Secondary | ICD-10-CM | POA: Diagnosis not present

## 2016-06-03 DIAGNOSIS — Z96611 Presence of right artificial shoulder joint: Secondary | ICD-10-CM | POA: Diagnosis not present

## 2016-06-03 DIAGNOSIS — Z471 Aftercare following joint replacement surgery: Secondary | ICD-10-CM | POA: Diagnosis not present

## 2016-06-07 DIAGNOSIS — Z96611 Presence of right artificial shoulder joint: Secondary | ICD-10-CM | POA: Diagnosis not present

## 2016-06-07 DIAGNOSIS — M25511 Pain in right shoulder: Secondary | ICD-10-CM | POA: Diagnosis not present

## 2016-06-07 DIAGNOSIS — Z471 Aftercare following joint replacement surgery: Secondary | ICD-10-CM | POA: Diagnosis not present

## 2016-06-11 DIAGNOSIS — Z96611 Presence of right artificial shoulder joint: Secondary | ICD-10-CM | POA: Diagnosis not present

## 2016-06-11 DIAGNOSIS — Z471 Aftercare following joint replacement surgery: Secondary | ICD-10-CM | POA: Diagnosis not present

## 2016-06-11 DIAGNOSIS — M25511 Pain in right shoulder: Secondary | ICD-10-CM | POA: Diagnosis not present

## 2016-06-13 DIAGNOSIS — Z471 Aftercare following joint replacement surgery: Secondary | ICD-10-CM | POA: Diagnosis not present

## 2016-06-13 DIAGNOSIS — M25511 Pain in right shoulder: Secondary | ICD-10-CM | POA: Diagnosis not present

## 2016-06-13 DIAGNOSIS — Z96611 Presence of right artificial shoulder joint: Secondary | ICD-10-CM | POA: Diagnosis not present

## 2016-06-18 DIAGNOSIS — Z471 Aftercare following joint replacement surgery: Secondary | ICD-10-CM | POA: Diagnosis not present

## 2016-06-18 DIAGNOSIS — M25511 Pain in right shoulder: Secondary | ICD-10-CM | POA: Diagnosis not present

## 2016-06-18 DIAGNOSIS — Z96611 Presence of right artificial shoulder joint: Secondary | ICD-10-CM | POA: Diagnosis not present

## 2016-06-20 DIAGNOSIS — Z96611 Presence of right artificial shoulder joint: Secondary | ICD-10-CM | POA: Diagnosis not present

## 2016-06-20 DIAGNOSIS — Z471 Aftercare following joint replacement surgery: Secondary | ICD-10-CM | POA: Diagnosis not present

## 2016-06-20 DIAGNOSIS — M25511 Pain in right shoulder: Secondary | ICD-10-CM | POA: Diagnosis not present

## 2016-06-25 DIAGNOSIS — M25511 Pain in right shoulder: Secondary | ICD-10-CM | POA: Diagnosis not present

## 2016-06-25 DIAGNOSIS — Z471 Aftercare following joint replacement surgery: Secondary | ICD-10-CM | POA: Diagnosis not present

## 2016-06-25 DIAGNOSIS — Z96611 Presence of right artificial shoulder joint: Secondary | ICD-10-CM | POA: Diagnosis not present

## 2016-06-27 DIAGNOSIS — Z471 Aftercare following joint replacement surgery: Secondary | ICD-10-CM | POA: Diagnosis not present

## 2016-06-27 DIAGNOSIS — M25511 Pain in right shoulder: Secondary | ICD-10-CM | POA: Diagnosis not present

## 2016-06-27 DIAGNOSIS — Z96611 Presence of right artificial shoulder joint: Secondary | ICD-10-CM | POA: Diagnosis not present

## 2016-07-02 DIAGNOSIS — Z471 Aftercare following joint replacement surgery: Secondary | ICD-10-CM | POA: Diagnosis not present

## 2016-07-02 DIAGNOSIS — Z96611 Presence of right artificial shoulder joint: Secondary | ICD-10-CM | POA: Diagnosis not present

## 2016-07-02 DIAGNOSIS — M25511 Pain in right shoulder: Secondary | ICD-10-CM | POA: Diagnosis not present

## 2016-07-09 DIAGNOSIS — Z96611 Presence of right artificial shoulder joint: Secondary | ICD-10-CM | POA: Diagnosis not present

## 2016-07-09 DIAGNOSIS — Z471 Aftercare following joint replacement surgery: Secondary | ICD-10-CM | POA: Diagnosis not present

## 2016-07-09 DIAGNOSIS — M25511 Pain in right shoulder: Secondary | ICD-10-CM | POA: Diagnosis not present

## 2016-07-10 DIAGNOSIS — M25511 Pain in right shoulder: Secondary | ICD-10-CM | POA: Diagnosis not present

## 2016-07-11 ENCOUNTER — Other Ambulatory Visit: Payer: Self-pay | Admitting: Obstetrics and Gynecology

## 2016-07-11 DIAGNOSIS — Z96611 Presence of right artificial shoulder joint: Secondary | ICD-10-CM | POA: Diagnosis not present

## 2016-07-11 DIAGNOSIS — Z1231 Encounter for screening mammogram for malignant neoplasm of breast: Secondary | ICD-10-CM

## 2016-07-11 DIAGNOSIS — M25511 Pain in right shoulder: Secondary | ICD-10-CM | POA: Diagnosis not present

## 2016-07-11 DIAGNOSIS — Z471 Aftercare following joint replacement surgery: Secondary | ICD-10-CM | POA: Diagnosis not present

## 2016-07-16 DIAGNOSIS — M25511 Pain in right shoulder: Secondary | ICD-10-CM | POA: Diagnosis not present

## 2016-07-16 DIAGNOSIS — Z471 Aftercare following joint replacement surgery: Secondary | ICD-10-CM | POA: Diagnosis not present

## 2016-07-16 DIAGNOSIS — Z96611 Presence of right artificial shoulder joint: Secondary | ICD-10-CM | POA: Diagnosis not present

## 2016-07-18 DIAGNOSIS — Z471 Aftercare following joint replacement surgery: Secondary | ICD-10-CM | POA: Diagnosis not present

## 2016-07-18 DIAGNOSIS — Z96611 Presence of right artificial shoulder joint: Secondary | ICD-10-CM | POA: Diagnosis not present

## 2016-07-18 DIAGNOSIS — M25511 Pain in right shoulder: Secondary | ICD-10-CM | POA: Diagnosis not present

## 2016-07-23 DIAGNOSIS — Z96611 Presence of right artificial shoulder joint: Secondary | ICD-10-CM | POA: Diagnosis not present

## 2016-07-23 DIAGNOSIS — M25511 Pain in right shoulder: Secondary | ICD-10-CM | POA: Diagnosis not present

## 2016-07-23 DIAGNOSIS — Z471 Aftercare following joint replacement surgery: Secondary | ICD-10-CM | POA: Diagnosis not present

## 2016-07-25 DIAGNOSIS — Z471 Aftercare following joint replacement surgery: Secondary | ICD-10-CM | POA: Diagnosis not present

## 2016-07-25 DIAGNOSIS — Z96611 Presence of right artificial shoulder joint: Secondary | ICD-10-CM | POA: Diagnosis not present

## 2016-07-25 DIAGNOSIS — M25511 Pain in right shoulder: Secondary | ICD-10-CM | POA: Diagnosis not present

## 2016-07-30 ENCOUNTER — Ambulatory Visit
Admission: RE | Admit: 2016-07-30 | Discharge: 2016-07-30 | Disposition: A | Discharge status: Home / Self Care | Payer: PPO | Source: Ambulatory Visit | Attending: Obstetrics and Gynecology | Admitting: Obstetrics and Gynecology

## 2016-07-30 DIAGNOSIS — Z471 Aftercare following joint replacement surgery: Secondary | ICD-10-CM | POA: Diagnosis not present

## 2016-07-30 DIAGNOSIS — M25511 Pain in right shoulder: Secondary | ICD-10-CM | POA: Diagnosis not present

## 2016-07-30 DIAGNOSIS — Z96611 Presence of right artificial shoulder joint: Secondary | ICD-10-CM | POA: Diagnosis not present

## 2016-07-30 DIAGNOSIS — Z1231 Encounter for screening mammogram for malignant neoplasm of breast: Secondary | ICD-10-CM

## 2016-08-02 DIAGNOSIS — Z96611 Presence of right artificial shoulder joint: Secondary | ICD-10-CM | POA: Diagnosis not present

## 2016-08-02 DIAGNOSIS — M25511 Pain in right shoulder: Secondary | ICD-10-CM | POA: Diagnosis not present

## 2016-08-02 DIAGNOSIS — Z471 Aftercare following joint replacement surgery: Secondary | ICD-10-CM | POA: Diagnosis not present

## 2016-08-06 DIAGNOSIS — Z96611 Presence of right artificial shoulder joint: Secondary | ICD-10-CM | POA: Diagnosis not present

## 2016-08-06 DIAGNOSIS — M25511 Pain in right shoulder: Secondary | ICD-10-CM | POA: Diagnosis not present

## 2016-08-06 DIAGNOSIS — Z471 Aftercare following joint replacement surgery: Secondary | ICD-10-CM | POA: Diagnosis not present

## 2016-08-08 DIAGNOSIS — Z96611 Presence of right artificial shoulder joint: Secondary | ICD-10-CM | POA: Diagnosis not present

## 2016-08-08 DIAGNOSIS — Z471 Aftercare following joint replacement surgery: Secondary | ICD-10-CM | POA: Diagnosis not present

## 2016-08-08 DIAGNOSIS — M25511 Pain in right shoulder: Secondary | ICD-10-CM | POA: Diagnosis not present

## 2016-08-20 DIAGNOSIS — Z01419 Encounter for gynecological examination (general) (routine) without abnormal findings: Secondary | ICD-10-CM | POA: Diagnosis not present

## 2016-08-28 DIAGNOSIS — F908 Attention-deficit hyperactivity disorder, other type: Secondary | ICD-10-CM | POA: Diagnosis not present

## 2016-10-09 DIAGNOSIS — M19011 Primary osteoarthritis, right shoulder: Secondary | ICD-10-CM | POA: Diagnosis not present

## 2016-10-09 DIAGNOSIS — M19012 Primary osteoarthritis, left shoulder: Secondary | ICD-10-CM | POA: Diagnosis not present

## 2016-11-29 DIAGNOSIS — L089 Local infection of the skin and subcutaneous tissue, unspecified: Secondary | ICD-10-CM | POA: Diagnosis not present

## 2016-11-29 DIAGNOSIS — B349 Viral infection, unspecified: Secondary | ICD-10-CM | POA: Diagnosis not present

## 2016-11-29 DIAGNOSIS — Z681 Body mass index (BMI) 19 or less, adult: Secondary | ICD-10-CM | POA: Diagnosis not present

## 2016-11-29 DIAGNOSIS — R112 Nausea with vomiting, unspecified: Secondary | ICD-10-CM | POA: Diagnosis not present

## 2016-12-03 DIAGNOSIS — L089 Local infection of the skin and subcutaneous tissue, unspecified: Secondary | ICD-10-CM | POA: Diagnosis not present

## 2016-12-03 DIAGNOSIS — S60943A Unspecified superficial injury of left middle finger, initial encounter: Secondary | ICD-10-CM | POA: Diagnosis not present

## 2016-12-05 DIAGNOSIS — S60943A Unspecified superficial injury of left middle finger, initial encounter: Secondary | ICD-10-CM | POA: Diagnosis not present

## 2016-12-05 DIAGNOSIS — L089 Local infection of the skin and subcutaneous tissue, unspecified: Secondary | ICD-10-CM | POA: Diagnosis not present

## 2017-02-25 DIAGNOSIS — Z0000 Encounter for general adult medical examination without abnormal findings: Secondary | ICD-10-CM | POA: Diagnosis not present

## 2017-02-26 DIAGNOSIS — F908 Attention-deficit hyperactivity disorder, other type: Secondary | ICD-10-CM | POA: Diagnosis not present

## 2017-03-04 DIAGNOSIS — J309 Allergic rhinitis, unspecified: Secondary | ICD-10-CM | POA: Diagnosis not present

## 2017-03-04 DIAGNOSIS — M25519 Pain in unspecified shoulder: Secondary | ICD-10-CM | POA: Diagnosis not present

## 2017-03-04 DIAGNOSIS — Z681 Body mass index (BMI) 19 or less, adult: Secondary | ICD-10-CM | POA: Diagnosis not present

## 2017-03-04 DIAGNOSIS — M19049 Primary osteoarthritis, unspecified hand: Secondary | ICD-10-CM | POA: Diagnosis not present

## 2017-03-04 DIAGNOSIS — R030 Elevated blood-pressure reading, without diagnosis of hypertension: Secondary | ICD-10-CM | POA: Diagnosis not present

## 2017-03-04 DIAGNOSIS — Z0000 Encounter for general adult medical examination without abnormal findings: Secondary | ICD-10-CM | POA: Diagnosis not present

## 2017-03-04 DIAGNOSIS — Z79890 Hormone replacement therapy (postmenopausal): Secondary | ICD-10-CM | POA: Diagnosis not present

## 2017-03-04 DIAGNOSIS — F900 Attention-deficit hyperactivity disorder, predominantly inattentive type: Secondary | ICD-10-CM | POA: Diagnosis not present

## 2017-03-04 DIAGNOSIS — Z1389 Encounter for screening for other disorder: Secondary | ICD-10-CM | POA: Diagnosis not present

## 2017-03-12 IMAGING — DX DG SHOULDER 2+V PORT*R*
1 series · 1 of 1 positions shown · non-contrast
Comparison: 04/12/2016

CLINICAL DATA: S/P shoulder replacement, right

EXAM:
PORTABLE RIGHT SHOULDER - 2+ VIEW

[shoulder ap]
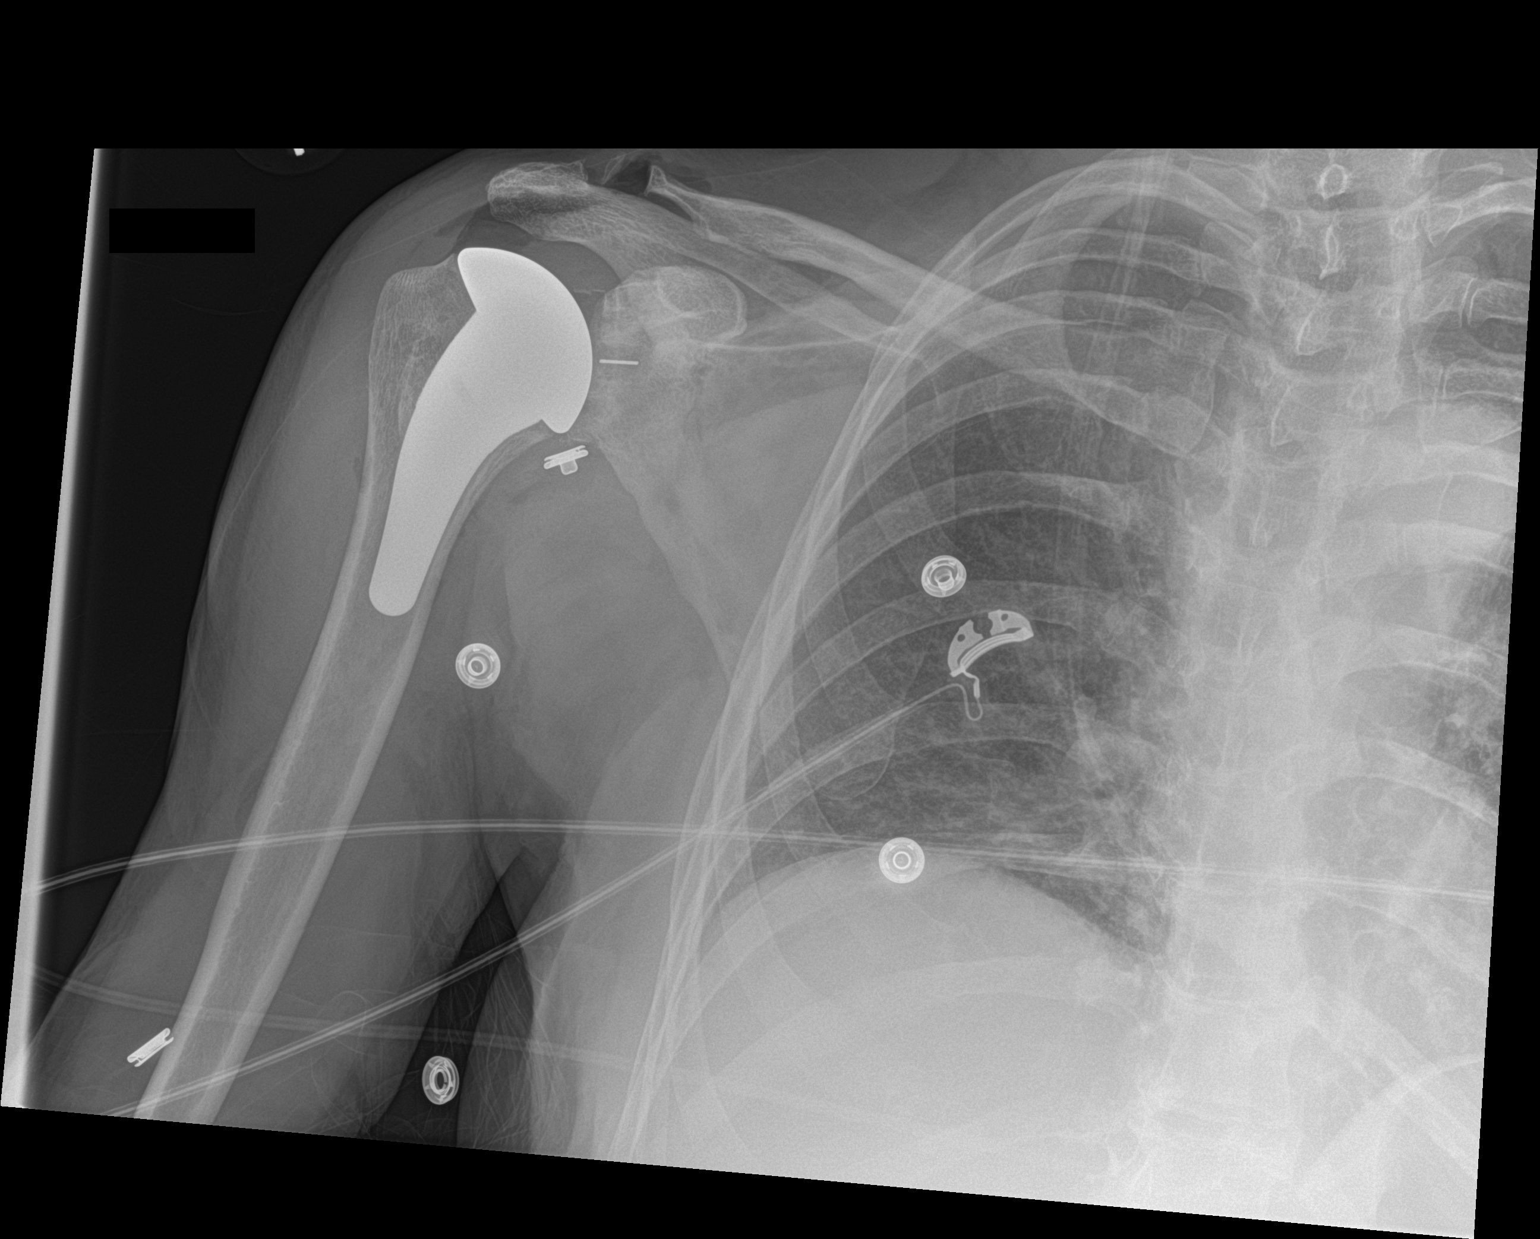

[1 of 1 positions shown; findings below may reference images not displayed]

FINDINGS: Patient has undergone right shoulder arthroplasty. The humeral head
component is normally positioned over the glenoid fossa on the
frontal view provided. Postoperative gas is identified in the
shoulder joint. No adverse features identified.
IMPRESSION: Status post right shoulder arthroplasty.

## 2017-04-25 DIAGNOSIS — Z471 Aftercare following joint replacement surgery: Secondary | ICD-10-CM | POA: Diagnosis not present

## 2017-04-25 DIAGNOSIS — M19111 Post-traumatic osteoarthritis, right shoulder: Secondary | ICD-10-CM | POA: Diagnosis not present

## 2017-04-25 DIAGNOSIS — M19112 Post-traumatic osteoarthritis, left shoulder: Secondary | ICD-10-CM | POA: Diagnosis not present

## 2017-04-25 DIAGNOSIS — Z96611 Presence of right artificial shoulder joint: Secondary | ICD-10-CM | POA: Diagnosis not present

## 2017-07-01 ENCOUNTER — Other Ambulatory Visit: Payer: Self-pay | Admitting: Obstetrics and Gynecology

## 2017-07-01 DIAGNOSIS — Z1231 Encounter for screening mammogram for malignant neoplasm of breast: Secondary | ICD-10-CM

## 2017-08-01 DIAGNOSIS — M19012 Primary osteoarthritis, left shoulder: Secondary | ICD-10-CM | POA: Diagnosis not present

## 2017-08-07 ENCOUNTER — Ambulatory Visit
Admission: RE | Admit: 2017-08-07 | Discharge: 2017-08-07 | Disposition: A | Discharge status: Home / Self Care | Payer: PPO | Source: Ambulatory Visit | Attending: Obstetrics and Gynecology | Admitting: Obstetrics and Gynecology

## 2017-08-07 DIAGNOSIS — Z1231 Encounter for screening mammogram for malignant neoplasm of breast: Secondary | ICD-10-CM

## 2017-08-18 ENCOUNTER — Other Ambulatory Visit: Payer: Self-pay | Admitting: Orthopedic Surgery

## 2017-08-20 ENCOUNTER — Other Ambulatory Visit: Payer: Self-pay | Admitting: Orthopedic Surgery

## 2017-08-20 DIAGNOSIS — M19012 Primary osteoarthritis, left shoulder: Secondary | ICD-10-CM

## 2017-08-22 ENCOUNTER — Ambulatory Visit
Admission: RE | Admit: 2017-08-22 | Discharge: 2017-08-22 | Disposition: A | Discharge status: Home / Self Care | Payer: PPO | Source: Ambulatory Visit | Attending: Orthopedic Surgery | Admitting: Orthopedic Surgery

## 2017-08-22 DIAGNOSIS — Z96611 Presence of right artificial shoulder joint: Secondary | ICD-10-CM | POA: Diagnosis not present

## 2017-08-22 DIAGNOSIS — M19012 Primary osteoarthritis, left shoulder: Secondary | ICD-10-CM

## 2017-08-22 DIAGNOSIS — Z471 Aftercare following joint replacement surgery: Secondary | ICD-10-CM | POA: Diagnosis not present

## 2017-08-25 NOTE — Pre-Procedure Instructions (Addendum)
Stephanie Savage  08/25/2017      [id number] - [place], [place] - [address] AT CORNER OF [facility] [address] [place] [address] Phone: [phone] Fax: [phone]  [id number] [place], [place] - [address] [place] [address] Phone: [phone] Fax: [phone]  [facility] [address], [place] - [address] [place] [address] Phone: [phone] Fax: [phone]    Your procedure is scheduled on Oct 4  Report to [facility] Admitting at 830 A.M.  Call this number if you have problems the morning of surgery:  [phone]   Remember:  Do not eat food or drink liquids after midnight.  Take these medicines the morning of surgery with A SIP OF WATER Allegra, Flonase nasal spray, Oxycodone (Roxicet) if needed  Stop taking aspirin, Bc's, Goody's, herbal medications, Fish Oil, Ibuprofen, Advil, Motrin, Aleve, Celebrex    Do not wear jewelry, make-up or nail polish.  Do not wear lotions, powders, or perfumes, or deoderant.  Do not shave 48 hours prior to surgery.  Men may shave face and neck.  Do not bring valuables to the hospital.  [facility] is not responsible for any belongings or valuables.  Contacts, dentures or bridgework may not be worn into surgery.  Leave your suitcase in the car.  After surgery it may be brought to your room.  For patients admitted to the hospital, discharge time will be determined by your treatment team.  Patients discharged the day of surgery will not be allowed to drive home.   Special instructions:  [facility] - Preparing for Surgery  Before surgery, you can play an important role.  Because skin is not sterile, your skin needs to be as free of germs as possible.  You can reduce the number of germs on you skin by washing with CHG (chlorahexidine gluconate) soap before surgery.  CHG is an antiseptic cleaner  which kills germs and bonds with the skin to continue killing germs even after washing.  Please DO NOT use if you have an allergy to CHG or antibacterial soaps.  If your skin becomes reddened/irritated stop using the CHG and inform your nurse when you arrive at Short Stay.  Do not shave (including legs and underarms) for at least 48 hours prior to the first CHG shower.  You may shave your face.  Please follow these instructions carefully:   1.  Shower with CHG Soap the night before surgery and the                                morning of Surgery.  2.  If you choose to wash your hair, wash your hair first as usual with your       normal shampoo.  3.  After you shampoo, rinse your hair and body thoroughly to remove the                      Shampoo.  4.  Use CHG as you would any other liquid soap.  You can apply chg directly       to the skin and wash gently with scrungie or a clean washcloth.  5.  Apply the CHG Soap to your body ONLY FROM THE NECK DOWN.        Do not use on open  wounds or open sores.  Avoid contact with your eyes,       ears, mouth and genitals (private parts).  Wash genitals (private parts)       with your normal soap.  6.  Wash thoroughly, paying special attention to the area where your surgery        will be performed.  7.  Thoroughly rinse your body with warm water from the neck down.  8.  DO NOT shower/wash with your normal soap after using and rinsing off       the CHG Soap.  9.  Pat yourself dry with a clean towel.            10.  Wear clean pajamas.            11.  Place clean sheets on your bed the night of your first shower and do not        sleep with pets.  Day of Surgery  Do not apply any lotions/deoderants the morning of surgery.  Please wear clean clothes to the hospital/surgery center.     Please read over the following fact sheets that you were given. Pain Booklet, Coughing and Deep Breathing and Surgical Site Infection Prevention

## 2017-08-26 ENCOUNTER — Encounter (HOSPITAL_COMMUNITY): Payer: Self-pay

## 2017-08-26 ENCOUNTER — Encounter (HOSPITAL_COMMUNITY)
Admission: RE | Admit: 2017-08-26 | Discharge: 2017-08-26 | Disposition: A | Discharge status: Home / Self Care | Payer: PPO | Source: Ambulatory Visit | Attending: Orthopedic Surgery | Admitting: Orthopedic Surgery

## 2017-08-26 ENCOUNTER — Ambulatory Visit (HOSPITAL_COMMUNITY)
Admission: RE | Admit: 2017-08-26 | Discharge: 2017-08-26 | Disposition: A | Discharge status: Home / Self Care | Payer: PPO | Source: Ambulatory Visit | Attending: Orthopedic Surgery | Admitting: Orthopedic Surgery

## 2017-08-26 DIAGNOSIS — F419 Anxiety disorder, unspecified: Secondary | ICD-10-CM | POA: Diagnosis present

## 2017-08-26 DIAGNOSIS — Z01818 Encounter for other preprocedural examination: Secondary | ICD-10-CM

## 2017-08-26 DIAGNOSIS — Z01812 Encounter for preprocedural laboratory examination: Secondary | ICD-10-CM | POA: Insufficient documentation

## 2017-08-26 DIAGNOSIS — Z471 Aftercare following joint replacement surgery: Secondary | ICD-10-CM | POA: Diagnosis not present

## 2017-08-26 DIAGNOSIS — D649 Anemia, unspecified: Secondary | ICD-10-CM

## 2017-08-26 DIAGNOSIS — Z885 Allergy status to narcotic agent status: Secondary | ICD-10-CM | POA: Diagnosis not present

## 2017-08-26 DIAGNOSIS — Z79899 Other long term (current) drug therapy: Secondary | ICD-10-CM | POA: Diagnosis not present

## 2017-08-26 DIAGNOSIS — M25712 Osteophyte, left shoulder: Secondary | ICD-10-CM | POA: Diagnosis present

## 2017-08-26 DIAGNOSIS — Z91040 Latex allergy status: Secondary | ICD-10-CM | POA: Diagnosis not present

## 2017-08-26 DIAGNOSIS — M1990 Unspecified osteoarthritis, unspecified site: Secondary | ICD-10-CM

## 2017-08-26 DIAGNOSIS — Z881 Allergy status to other antibiotic agents status: Secondary | ICD-10-CM | POA: Diagnosis not present

## 2017-08-26 DIAGNOSIS — J45909 Unspecified asthma, uncomplicated: Secondary | ICD-10-CM

## 2017-08-26 DIAGNOSIS — K219 Gastro-esophageal reflux disease without esophagitis: Secondary | ICD-10-CM | POA: Diagnosis present

## 2017-08-26 DIAGNOSIS — Z01810 Encounter for preprocedural cardiovascular examination: Secondary | ICD-10-CM

## 2017-08-26 DIAGNOSIS — M19012 Primary osteoarthritis, left shoulder: Secondary | ICD-10-CM | POA: Diagnosis present

## 2017-08-26 DIAGNOSIS — Z882 Allergy status to sulfonamides status: Secondary | ICD-10-CM | POA: Diagnosis not present

## 2017-08-26 DIAGNOSIS — K602 Anal fissure, unspecified: Secondary | ICD-10-CM | POA: Diagnosis not present

## 2017-08-26 DIAGNOSIS — M25512 Pain in left shoulder: Secondary | ICD-10-CM | POA: Diagnosis present

## 2017-08-26 DIAGNOSIS — G8918 Other acute postprocedural pain: Secondary | ICD-10-CM | POA: Diagnosis not present

## 2017-08-26 DIAGNOSIS — Z7951 Long term (current) use of inhaled steroids: Secondary | ICD-10-CM | POA: Diagnosis not present

## 2017-08-26 DIAGNOSIS — Z79890 Hormone replacement therapy (postmenopausal): Secondary | ICD-10-CM | POA: Diagnosis not present

## 2017-08-26 DIAGNOSIS — Z96612 Presence of left artificial shoulder joint: Secondary | ICD-10-CM | POA: Diagnosis not present

## 2017-08-26 LAB — CBC WITH DIFFERENTIAL/PLATELET
Basophils Absolute: 0 10*3/uL (ref 0.0–0.1)
Basophils Relative: 0 %
Eosinophils Absolute: 0.1 10*3/uL (ref 0.0–0.7)
Eosinophils Relative: 2 %
HCT: 42.1 % (ref 36.0–46.0)
Hemoglobin: 13.9 g/dL (ref 12.0–15.0)
Lymphocytes Relative: 20 %
Lymphs Abs: 1.6 10*3/uL (ref 0.7–4.0)
MCH: 31.2 pg (ref 26.0–34.0)
MCHC: 33 g/dL (ref 30.0–36.0)
MCV: 94.6 fL (ref 78.0–100.0)
Monocytes Absolute: 0.5 10*3/uL (ref 0.1–1.0)
Monocytes Relative: 6 %
Neutro Abs: 5.7 10*3/uL (ref 1.7–7.7)
Neutrophils Relative %: 72 %
Platelets: 275 10*3/uL (ref 150–400)
RBC: 4.45 MIL/uL (ref 3.87–5.11)
RDW: 13.2 % (ref 11.5–15.5)
WBC: 7.9 10*3/uL (ref 4.0–10.5)

## 2017-08-26 LAB — COMPREHENSIVE METABOLIC PANEL
ALT: 34 U/L (ref 14–54)
AST: 38 U/L (ref 15–41)
Albumin: 3.7 g/dL (ref 3.5–5.0)
Alkaline Phosphatase: 67 U/L (ref 38–126)
Anion gap: 7 (ref 5–15)
BUN: 17 mg/dL (ref 6–20)
CO2: 27 mmol/L (ref 22–32)
Calcium: 9.7 mg/dL (ref 8.9–10.3)
Chloride: 104 mmol/L (ref 101–111)
Creatinine, Ser: 0.87 mg/dL (ref 0.44–1.00)
GFR calc Af Amer: >60 mL/min (ref >60)
GFR calc non Af Amer: >60 mL/min (ref >60)
Glucose, Bld: 118 mg/dL — ABNORMAL HIGH (ref 65–99)
Potassium: 3.9 mmol/L (ref 3.5–5.1)
Sodium: 138 mmol/L (ref 135–145)
Total Bilirubin: 0.4 mg/dL (ref 0.3–1.2)
Total Protein: 6.1 g/dL — ABNORMAL LOW (ref 6.5–8.1)

## 2017-08-26 LAB — APTT: aPTT: 24 seconds (ref 24–36)

## 2017-08-26 LAB — URINALYSIS, ROUTINE W REFLEX MICROSCOPIC
Bilirubin Urine: NEGATIVE
Glucose, UA: NEGATIVE mg/dL
Hgb urine dipstick: NEGATIVE
Ketones, ur: NEGATIVE mg/dL
Leukocytes, UA: NEGATIVE
Nitrite: NEGATIVE
Protein, ur: NEGATIVE mg/dL
Specific Gravity, Urine: 1.015 (ref 1.005–1.030)
pH: 5 (ref 5.0–8.0)

## 2017-08-26 LAB — SURGICAL PCR SCREEN
MRSA, PCR: NEGATIVE
Staphylococcus aureus: NEGATIVE

## 2017-08-26 LAB — PROTIME-INR
INR: 0.98
Prothrombin Time: 12.9 seconds (ref 11.4–15.2)

## 2017-08-26 NOTE — Progress Notes (Signed)
PCP is Martha Clan Denies seeing a cardiologist. Denies any chest pain, fever, or cough.  Denies ever having a  Card cath or stress test.  States that she may have had an echo but if she did it was 20 years ago or more.

## 2017-08-27 MED ORDER — CEFAZOLIN SODIUM-DEXTROSE 2-4 GM/100ML-% IV SOLN
2.0000 g | INTRAVENOUS | Status: AC
  Administered 2017-08-28: 2 g via INTRAVENOUS
  Filled 2017-08-27 (×2): qty 100

## 2017-08-27 NOTE — Progress Notes (Signed)
Anesthesia Chart Review:  Pt is a 66 year old female scheduled for L total shoulder arthroplasty on 08/28/2017 with Jones Broom, MD  - PCP is Martha Clan, MD  PMH includes:  Anemia, asthma, rectocele, GERD. Never smoker. BMI 19. S/p R shoulder arthroplasty 04/18/16.   Medications include: ritalin  Preoperative labs reviewed.    CXR 08/26/17: No active cardiopulmonary disease  EKG 08/26/17: Sinus rhythm with PACs. LAD. Septal infarct, age undetermined.    If no changes, I anticipate pt can proceed with surgery as scheduled.   Rica Mast, FNP-BC [facility] Short Stay Surgical Center/Anesthesiology Phone: [phone] 08/27/2017 10:48 AM

## 2017-08-27 NOTE — Anesthesia Preprocedure Evaluation (Addendum)
Anesthesia Evaluation  Patient identified by MRN, date of birth, ID band Patient awake    Reviewed: Allergy & Precautions, NPO status , Patient's Chart, lab work & pertinent test results  Airway Mallampati: II  TM Distance: >3 FB Neck ROM: Full    Dental  (+) Dental Advisory Given, Teeth Intact   Pulmonary asthma ,    Pulmonary exam normal breath sounds clear to auscultation       Cardiovascular Exercise Tolerance: Good negative cardio ROS Normal cardiovascular exam Rhythm:Regular Rate:Normal  EKG - septal Q waves, PACs   Neuro/Psych Anxiety negative neurological ROS     GI/Hepatic Neg liver ROS, GERD  Controlled,  Endo/Other  negative endocrine ROS  Renal/GU negative Renal ROS  negative genitourinary   Musculoskeletal  (+) Arthritis , Osteoarthritis,    Abdominal   Peds negative pediatric ROS (+)  Hematology negative hematology ROS (+)   Anesthesia Other Findings   Reproductive/Obstetrics negative OB ROS                            Anesthesia Physical  Anesthesia Plan  ASA: II  Anesthesia Plan: General   Post-op Pain Management:  Regional for Post-op pain   Induction: Intravenous  PONV Risk Score and Plan: 4 or greater and Ondansetron, Dexamethasone, Midazolam, Scopolamine patch - Pre-op and Treatment may vary due to age or medical condition  Airway Management Planned: Oral ETT  Additional Equipment: None  Intra-op Plan:   Post-operative Plan: Extubation in OR  Informed Consent: I have reviewed the patients History and Physical, chart, labs and discussed the procedure including the risks, benefits and alternatives for the proposed anesthesia with the patient or authorized representative who has indicated his/her understanding and acceptance.   Dental advisory given  Plan Discussed with: CRNA  Anesthesia Plan Comments:         Anesthesia Quick Evaluation

## 2017-08-28 ENCOUNTER — Encounter (HOSPITAL_COMMUNITY): Payer: Self-pay | Admitting: *Deleted

## 2017-08-28 ENCOUNTER — Inpatient Hospital Stay (HOSPITAL_COMMUNITY): Payer: PPO | Admitting: Emergency Medicine

## 2017-08-28 ENCOUNTER — Encounter (HOSPITAL_COMMUNITY)
Admission: RE | Disposition: A | Discharge status: Home / Self Care | Payer: Self-pay | Source: Home / Self Care | Attending: Orthopedic Surgery

## 2017-08-28 ENCOUNTER — Inpatient Hospital Stay (HOSPITAL_COMMUNITY)
Admission: RE | Admit: 2017-08-28 | Discharge: 2017-08-29 | DRG: 483 | Disposition: A | Discharge status: Home / Self Care | Payer: PPO | Attending: Orthopedic Surgery | Admitting: Orthopedic Surgery

## 2017-08-28 ENCOUNTER — Inpatient Hospital Stay (HOSPITAL_COMMUNITY): Payer: PPO | Admitting: Anesthesiology

## 2017-08-28 ENCOUNTER — Inpatient Hospital Stay (HOSPITAL_COMMUNITY): Payer: PPO

## 2017-08-28 DIAGNOSIS — Z881 Allergy status to other antibiotic agents status: Secondary | ICD-10-CM | POA: Diagnosis not present

## 2017-08-28 DIAGNOSIS — Z91040 Latex allergy status: Secondary | ICD-10-CM | POA: Diagnosis not present

## 2017-08-28 DIAGNOSIS — Z79890 Hormone replacement therapy (postmenopausal): Secondary | ICD-10-CM | POA: Diagnosis not present

## 2017-08-28 DIAGNOSIS — M25712 Osteophyte, left shoulder: Secondary | ICD-10-CM | POA: Diagnosis present

## 2017-08-28 DIAGNOSIS — M25512 Pain in left shoulder: Secondary | ICD-10-CM | POA: Diagnosis present

## 2017-08-28 DIAGNOSIS — Z7951 Long term (current) use of inhaled steroids: Secondary | ICD-10-CM

## 2017-08-28 DIAGNOSIS — J45909 Unspecified asthma, uncomplicated: Secondary | ICD-10-CM | POA: Diagnosis present

## 2017-08-28 DIAGNOSIS — F419 Anxiety disorder, unspecified: Secondary | ICD-10-CM | POA: Diagnosis present

## 2017-08-28 DIAGNOSIS — Z885 Allergy status to narcotic agent status: Secondary | ICD-10-CM | POA: Diagnosis not present

## 2017-08-28 DIAGNOSIS — M19012 Primary osteoarthritis, left shoulder: Secondary | ICD-10-CM | POA: Diagnosis present

## 2017-08-28 DIAGNOSIS — Z882 Allergy status to sulfonamides status: Secondary | ICD-10-CM

## 2017-08-28 DIAGNOSIS — K219 Gastro-esophageal reflux disease without esophagitis: Secondary | ICD-10-CM | POA: Diagnosis present

## 2017-08-28 DIAGNOSIS — Z96612 Presence of left artificial shoulder joint: Secondary | ICD-10-CM

## 2017-08-28 DIAGNOSIS — Z79899 Other long term (current) drug therapy: Secondary | ICD-10-CM

## 2017-08-28 HISTORY — PX: TOTAL SHOULDER ARTHROPLASTY: SHX126

## 2017-08-28 SURGERY — ARTHROPLASTY, SHOULDER, TOTAL
Anesthesia: General | Site: Shoulder | Laterality: Left

## 2017-08-28 MED ORDER — MENTHOL 3 MG MT LOZG: 1.0000 | LOZENGE | OROMUCOSAL | Status: DC | PRN

## 2017-08-28 MED ORDER — FENTANYL CITRATE (PF) 100 MCG/2ML IJ SOLN
50.0000 ug | Freq: Once | INTRAMUSCULAR | Status: AC
  Administered 2017-08-28: 50 ug via INTRAVENOUS

## 2017-08-28 MED ORDER — MIDAZOLAM HCL 2 MG/2ML IJ SOLN
1.0000 mg | Freq: Once | INTRAMUSCULAR | Status: AC
  Administered 2017-08-28: 1 mg via INTRAVENOUS

## 2017-08-28 MED ORDER — ACETAMINOPHEN 500 MG PO TABS
1000.0000 mg | ORAL_TABLET | Freq: Four times a day (QID) | ORAL | Status: DC
  Administered 2017-08-28 – 2017-08-29 (×3): 1000 mg via ORAL
  Filled 2017-08-28 (×3): qty 2

## 2017-08-28 MED ORDER — PHENYLEPHRINE 40 MCG/ML (10ML) SYRINGE FOR IV PUSH (FOR BLOOD PRESSURE SUPPORT)
PREFILLED_SYRINGE | INTRAVENOUS | Status: DC | PRN
  Administered 2017-08-28: 80 ug via INTRAVENOUS

## 2017-08-28 MED ORDER — SUGAMMADEX SODIUM 200 MG/2ML IV SOLN
INTRAVENOUS | Status: AC
  Filled 2017-08-28: qty 2

## 2017-08-28 MED ORDER — NIACIN 100 MG PO TABS
100.0000 mg | ORAL_TABLET | Freq: Every day | ORAL | Status: DC
  Filled 2017-08-28: qty 1

## 2017-08-28 MED ORDER — MIDAZOLAM HCL 2 MG/2ML IJ SOLN
INTRAMUSCULAR | Status: AC
  Filled 2017-08-28: qty 2

## 2017-08-28 MED ORDER — ONDANSETRON HCL 4 MG/2ML IJ SOLN
INTRAMUSCULAR | Status: DC | PRN
  Administered 2017-08-28: 4 mg via INTRAVENOUS

## 2017-08-28 MED ORDER — METHOCARBAMOL 500 MG PO TABS
500.0000 mg | ORAL_TABLET | Freq: Four times a day (QID) | ORAL | Status: DC | PRN
  Administered 2017-08-28 – 2017-08-29 (×3): 500 mg via ORAL
  Filled 2017-08-28 (×3): qty 1

## 2017-08-28 MED ORDER — ONDANSETRON HCL 4 MG/2ML IJ SOLN
INTRAMUSCULAR | Status: AC
  Filled 2017-08-28: qty 2

## 2017-08-28 MED ORDER — MIDAZOLAM HCL 5 MG/5ML IJ SOLN
INTRAMUSCULAR | Status: DC | PRN
  Administered 2017-08-28: 2 mg via INTRAVENOUS

## 2017-08-28 MED ORDER — METHYLPHENIDATE HCL 5 MG PO TABS
20.0000 mg | ORAL_TABLET | Freq: Three times a day (TID) | ORAL | Status: DC
  Filled 2017-08-28 (×3): qty 4

## 2017-08-28 MED ORDER — FENTANYL CITRATE (PF) 100 MCG/2ML IJ SOLN
25.0000 ug | INTRAMUSCULAR | Status: DC | PRN
  Administered 2017-08-28 (×2): 50 ug via INTRAVENOUS

## 2017-08-28 MED ORDER — METOCLOPRAMIDE HCL 5 MG/ML IJ SOLN: 5.0000 mg | Freq: Three times a day (TID) | INTRAMUSCULAR | Status: DC | PRN

## 2017-08-28 MED ORDER — ZOLPIDEM TARTRATE 5 MG PO TABS: 5.0000 mg | ORAL_TABLET | Freq: Every evening | ORAL | Status: DC | PRN

## 2017-08-28 MED ORDER — ONDANSETRON HCL 4 MG/2ML IJ SOLN: 4.0000 mg | Freq: Once | INTRAMUSCULAR | Status: DC | PRN

## 2017-08-28 MED ORDER — SUGAMMADEX SODIUM 200 MG/2ML IV SOLN
INTRAVENOUS | Status: DC | PRN
  Administered 2017-08-28: 101.4 mg via INTRAVENOUS

## 2017-08-28 MED ORDER — MIDAZOLAM HCL 2 MG/2ML IJ SOLN
INTRAMUSCULAR | Status: AC
  Administered 2017-08-28: 1 mg via INTRAVENOUS
  Filled 2017-08-28: qty 2

## 2017-08-28 MED ORDER — ROCURONIUM BROMIDE 10 MG/ML (PF) SYRINGE
PREFILLED_SYRINGE | INTRAVENOUS | Status: AC
  Filled 2017-08-28: qty 5

## 2017-08-28 MED ORDER — FENTANYL CITRATE (PF) 100 MCG/2ML IJ SOLN
INTRAMUSCULAR | Status: AC
  Administered 2017-08-28: 50 ug via INTRAVENOUS
  Filled 2017-08-28: qty 2

## 2017-08-28 MED ORDER — POVIDONE-IODINE 7.5 % EX SOLN
Freq: Once | CUTANEOUS | Status: DC
  Filled 2017-08-28: qty 118

## 2017-08-28 MED ORDER — PHENOL 1.4 % MT LIQD: 1.0000 | OROMUCOSAL | Status: DC | PRN

## 2017-08-28 MED ORDER — SODIUM CHLORIDE 0.9 % IV SOLN: INTRAVENOUS | Status: DC

## 2017-08-28 MED ORDER — LORATADINE 10 MG PO TABS
10.0000 mg | ORAL_TABLET | Freq: Every day | ORAL | Status: DC
  Administered 2017-08-29: 10 mg via ORAL
  Filled 2017-08-28: qty 1

## 2017-08-28 MED ORDER — FLEET ENEMA 7-19 GM/118ML RE ENEM: 1.0000 | ENEMA | Freq: Once | RECTAL | Status: DC | PRN

## 2017-08-28 MED ORDER — PROPOFOL 10 MG/ML IV BOLUS
INTRAVENOUS | Status: DC | PRN
Start: 2017-08-28 — End: 2017-08-28
  Administered 2017-08-28: 90 mg via INTRAVENOUS

## 2017-08-28 MED ORDER — LIDOCAINE 2% (20 MG/ML) 5 ML SYRINGE
INTRAMUSCULAR | Status: DC | PRN
  Administered 2017-08-28: 40 mg via INTRAVENOUS

## 2017-08-28 MED ORDER — BUPIVACAINE LIPOSOME 1.3 % IJ SUSP: INTRAMUSCULAR | Status: DC | PRN

## 2017-08-28 MED ORDER — BUPIVACAINE LIPOSOME 1.3 % IJ SUSP
20.0000 mL | INTRAMUSCULAR | Status: DC
  Filled 2017-08-28: qty 20

## 2017-08-28 MED ORDER — POLYETHYLENE GLYCOL 3350 17 G PO PACK: 17.0000 g | PACK | Freq: Every day | ORAL | Status: DC | PRN

## 2017-08-28 MED ORDER — MORPHINE SULFATE (PF) 4 MG/ML IV SOLN
1.0000 mg | INTRAVENOUS | Status: DC | PRN
Start: 2017-08-28 — End: 2017-08-29
  Administered 2017-08-28: 2 mg via INTRAVENOUS
  Filled 2017-08-28: qty 1

## 2017-08-28 MED ORDER — KETOROLAC TROMETHAMINE 30 MG/ML IJ SOLN
INTRAMUSCULAR | Status: AC
  Filled 2017-08-28: qty 1

## 2017-08-28 MED ORDER — LACTATED RINGERS IV SOLN
INTRAVENOUS | Status: DC
  Administered 2017-08-28 (×2): via INTRAVENOUS

## 2017-08-28 MED ORDER — OXYCODONE HCL 5 MG PO TABS
5.0000 mg | ORAL_TABLET | ORAL | Status: DC | PRN
  Administered 2017-08-28: 5 mg via ORAL
  Administered 2017-08-29 (×2): 10 mg via ORAL
  Filled 2017-08-28 (×2): qty 2
  Filled 2017-08-28: qty 1

## 2017-08-28 MED ORDER — BUPIVACAINE-EPINEPHRINE (PF) 0.5% -1:200000 IJ SOLN
INTRAMUSCULAR | Status: DC | PRN
  Administered 2017-08-28: 25 mL via PERINEURAL

## 2017-08-28 MED ORDER — ONDANSETRON HCL 4 MG/2ML IJ SOLN
4.0000 mg | Freq: Four times a day (QID) | INTRAMUSCULAR | Status: DC | PRN
Start: 2017-08-28 — End: 2017-08-29

## 2017-08-28 MED ORDER — ACETAMINOPHEN 650 MG RE SUPP: 650.0000 mg | Freq: Four times a day (QID) | RECTAL | Status: DC | PRN

## 2017-08-28 MED ORDER — BISACODYL 5 MG PO TBEC: 5.0000 mg | DELAYED_RELEASE_TABLET | Freq: Every day | ORAL | Status: DC | PRN

## 2017-08-28 MED ORDER — DOCUSATE SODIUM 100 MG PO CAPS
100.0000 mg | ORAL_CAPSULE | Freq: Two times a day (BID) | ORAL | Status: DC
  Administered 2017-08-28 – 2017-08-29 (×3): 100 mg via ORAL
  Filled 2017-08-28 (×3): qty 1

## 2017-08-28 MED ORDER — LIDOCAINE 2% (20 MG/ML) 5 ML SYRINGE
INTRAMUSCULAR | Status: AC
  Filled 2017-08-28: qty 5

## 2017-08-28 MED ORDER — TRANEXAMIC ACID 1000 MG/10ML IV SOLN
1000.0000 mg | INTRAVENOUS | Status: AC
  Administered 2017-08-28: 1000 mg via INTRAVENOUS
  Filled 2017-08-28: qty 10

## 2017-08-28 MED ORDER — PHENYLEPHRINE HCL 10 MG/ML IJ SOLN
INTRAVENOUS | Status: DC | PRN
  Administered 2017-08-28: 25 ug/min via INTRAVENOUS

## 2017-08-28 MED ORDER — ACETAMINOPHEN 325 MG PO TABS: 650.0000 mg | ORAL_TABLET | Freq: Four times a day (QID) | ORAL | Status: DC | PRN

## 2017-08-28 MED ORDER — DIPHENHYDRAMINE HCL 12.5 MG/5ML PO ELIX: 12.5000 mg | ORAL_SOLUTION | ORAL | Status: DC | PRN

## 2017-08-28 MED ORDER — SODIUM CHLORIDE 0.9% FLUSH
INTRAVENOUS | Status: DC | PRN
  Administered 2017-08-28: 40 mL

## 2017-08-28 MED ORDER — METOCLOPRAMIDE HCL 5 MG PO TABS: 5.0000 mg | ORAL_TABLET | Freq: Three times a day (TID) | ORAL | Status: DC | PRN

## 2017-08-28 MED ORDER — ONDANSETRON HCL 4 MG PO TABS: 4.0000 mg | ORAL_TABLET | Freq: Four times a day (QID) | ORAL | Status: DC | PRN

## 2017-08-28 MED ORDER — METHOCARBAMOL 1000 MG/10ML IJ SOLN: 500.0000 mg | Freq: Four times a day (QID) | INTRAVENOUS | Status: DC | PRN

## 2017-08-28 MED ORDER — FENTANYL CITRATE (PF) 250 MCG/5ML IJ SOLN
INTRAMUSCULAR | Status: AC
  Filled 2017-08-28: qty 5

## 2017-08-28 MED ORDER — PROPOFOL 10 MG/ML IV BOLUS
INTRAVENOUS | Status: AC
  Filled 2017-08-28: qty 20

## 2017-08-28 MED ORDER — ESTRADIOL-NORETHINDRONE ACET 1-0.5 MG PO TABS: 1.0000 | ORAL_TABLET | Freq: Every day | ORAL | Status: DC

## 2017-08-28 MED ORDER — SODIUM CHLORIDE 0.9 % IR SOLN
Status: DC | PRN
  Administered 2017-08-28: 3000 mL

## 2017-08-28 MED ORDER — CEFAZOLIN SODIUM-DEXTROSE 2-4 GM/100ML-% IV SOLN
2.0000 g | Freq: Four times a day (QID) | INTRAVENOUS | Status: AC
  Administered 2017-08-28 – 2017-08-29 (×3): 2 g via INTRAVENOUS
  Filled 2017-08-28 (×4): qty 100

## 2017-08-28 MED ORDER — ASPIRIN EC 325 MG PO TBEC
325.0000 mg | DELAYED_RELEASE_TABLET | Freq: Every day | ORAL | Status: DC
  Administered 2017-08-28 – 2017-08-29 (×2): 325 mg via ORAL
  Filled 2017-08-28 (×2): qty 1

## 2017-08-28 MED ORDER — ALUM & MAG HYDROXIDE-SIMETH 200-200-20 MG/5ML PO SUSP: 30.0000 mL | ORAL | Status: DC | PRN

## 2017-08-28 MED ORDER — DEXAMETHASONE SODIUM PHOSPHATE 10 MG/ML IJ SOLN
INTRAMUSCULAR | Status: DC | PRN
  Administered 2017-08-28: 5 mg via INTRAVENOUS

## 2017-08-28 MED ORDER — DEXAMETHASONE SODIUM PHOSPHATE 10 MG/ML IJ SOLN
INTRAMUSCULAR | Status: AC
  Filled 2017-08-28: qty 1

## 2017-08-28 MED ORDER — PHENYLEPHRINE 40 MCG/ML (10ML) SYRINGE FOR IV PUSH (FOR BLOOD PRESSURE SUPPORT)
PREFILLED_SYRINGE | INTRAVENOUS | Status: AC
  Filled 2017-08-28: qty 10

## 2017-08-28 MED ORDER — ROCURONIUM BROMIDE 10 MG/ML (PF) SYRINGE
PREFILLED_SYRINGE | INTRAVENOUS | Status: DC | PRN
Start: 2017-08-28 — End: 2017-08-28
  Administered 2017-08-28: 50 mg via INTRAVENOUS
  Administered 2017-08-28: 20 mg via INTRAVENOUS

## 2017-08-28 MED ORDER — 0.9 % SODIUM CHLORIDE (POUR BTL) OPTIME
TOPICAL | Status: DC | PRN
  Administered 2017-08-28: 1000 mL

## 2017-08-28 SURGICAL SUPPLY — 73 items
AID PSTN UNV HD RSTRNT DISP (MISCELLANEOUS) ×1
BIT DRILL 5/64X5 DISP (BIT) ×1 IMPLANT
BLADE SAW SAG 73X25 THK (BLADE) ×1
BLADE SAW SGTL 73X25 THK (BLADE) ×1 IMPLANT
BLADE SURG 15 STRL LF DISP TIS (BLADE) ×1 IMPLANT
BLADE SURG 15 STRL SS (BLADE) ×2
CAP SHOULDER TOTAL 1 ×1 IMPLANT
CEMENT BONE DEPUY (Cement) ×1 IMPLANT
CHLORAPREP W/TINT 26ML (MISCELLANEOUS) ×2 IMPLANT
CLSR STERI-STRIP ANTIMIC 1/2X4 (GAUZE/BANDAGES/DRESSINGS) ×1 IMPLANT
COVER SURGICAL LIGHT HANDLE (MISCELLANEOUS) ×2 IMPLANT
DRAPE INCISE IOBAN 66X45 STRL (DRAPES) ×2 IMPLANT
DRAPE ORTHO SPLIT 77X108 STRL (DRAPES) ×4
DRAPE SURG 17X23 STRL (DRAPES) ×2 IMPLANT
DRAPE SURG ORHT 6 SPLT 77X108 (DRAPES) ×2 IMPLANT
DRAPE U-SHAPE 47X51 STRL (DRAPES) ×2 IMPLANT
DRSG AQUACEL AG ADV 3.5X10 (GAUZE/BANDAGES/DRESSINGS) ×1 IMPLANT
ELECT BLADE 4.0 EZ CLEAN MEGAD (MISCELLANEOUS)
ELECT REM PT RETURN 9FT ADLT (ELECTROSURGICAL) ×2
ELECTRODE BLDE 4.0 EZ CLN MEGD (MISCELLANEOUS) IMPLANT
ELECTRODE REM PT RTRN 9FT ADLT (ELECTROSURGICAL) ×1 IMPLANT
GLENOID LEFT 35D CORTILOC SM (Joint) ×1 IMPLANT
GLOVE BIO SURGEON STRL SZ7 (GLOVE) ×2 IMPLANT
GLOVE BIO SURGEON STRL SZ7.5 (GLOVE) ×2 IMPLANT
GLOVE BIOGEL PI IND STRL 7.0 (GLOVE) ×1 IMPLANT
GLOVE BIOGEL PI IND STRL 8 (GLOVE) ×1 IMPLANT
GLOVE BIOGEL PI INDICATOR 7.0 (GLOVE) ×1
GLOVE BIOGEL PI INDICATOR 8 (GLOVE) ×1
GOWN STRL REUS W/ TWL LRG LVL3 (GOWN DISPOSABLE) ×1 IMPLANT
GOWN STRL REUS W/ TWL XL LVL3 (GOWN DISPOSABLE) ×1 IMPLANT
GOWN STRL REUS W/TWL LRG LVL3 (GOWN DISPOSABLE) ×2
GOWN STRL REUS W/TWL XL LVL3 (GOWN DISPOSABLE) ×2
GUIDEWIRE GLENOID 2.5X220 (WIRE) ×1 IMPLANT
HANDPIECE INTERPULSE COAX TIP (DISPOSABLE) ×2
HEMOSTAT SURGICEL 2X14 (HEMOSTASIS) ×2 IMPLANT
HOOD PEEL AWAY FLYTE STAYCOOL (MISCELLANEOUS) ×4 IMPLANT
KIT BASIN OR (CUSTOM PROCEDURE TRAY) ×2 IMPLANT
KIT ROOM TURNOVER OR (KITS) ×2 IMPLANT
MANIFOLD NEPTUNE II (INSTRUMENTS) ×2 IMPLANT
NDL HYPO 25GX1X1/2 BEV (NEEDLE) IMPLANT
NDL MAYO TROCAR (NEEDLE) ×1 IMPLANT
NDL SPNL 18GX3.5 QUINCKE PK (NEEDLE) ×2 IMPLANT
NEEDLE HYPO 25GX1X1/2 BEV (NEEDLE) ×2 IMPLANT
NEEDLE MAYO TROCAR (NEEDLE) ×2 IMPLANT
NEEDLE SPNL 18GX3.5 QUINCKE PK (NEEDLE) ×4 IMPLANT
NS IRRIG 1000ML POUR BTL (IV SOLUTION) ×2 IMPLANT
PACK SHOULDER (CUSTOM PROCEDURE TRAY) ×2 IMPLANT
PAD ARMBOARD 7.5X6 YLW CONV (MISCELLANEOUS) ×4 IMPLANT
RESTRAINT HEAD UNIVERSAL NS (MISCELLANEOUS) ×2 IMPLANT
RETRIEVER SUT HEWSON (MISCELLANEOUS) ×2 IMPLANT
SET HNDPC FAN SPRY TIP SCT (DISPOSABLE) ×1 IMPLANT
SLING ARM FOAM STRAP LRG (SOFTGOODS) ×2 IMPLANT
SLING ARM FOAM STRAP MED (SOFTGOODS) IMPLANT
SMARTMIX MINI TOWER (MISCELLANEOUS) ×2
SPONGE LAP 18X18 X RAY DECT (DISPOSABLE) ×2 IMPLANT
SPONGE LAP 4X18 X RAY DECT (DISPOSABLE) IMPLANT
STRIP CLOSURE SKIN 1/2X4 (GAUZE/BANDAGES/DRESSINGS) ×2 IMPLANT
SUCTION FRAZIER HANDLE 10FR (MISCELLANEOUS) ×1
SUCTION TUBE FRAZIER 10FR DISP (MISCELLANEOUS) ×1 IMPLANT
SUPPORT WRAP ARM LG (MISCELLANEOUS) ×2 IMPLANT
SUT ETHIBOND NAB CT1 #1 30IN (SUTURE) ×6 IMPLANT
SUT FIBERWIRE #2 38 T-5 BLUE (SUTURE)
SUT MNCRL AB 4-0 PS2 18 (SUTURE) ×2 IMPLANT
SUT VIC AB 2-0 CT1 27 (SUTURE) ×2
SUT VIC AB 2-0 CT1 TAPERPNT 27 (SUTURE) ×1 IMPLANT
SUTURE FIBERWR #2 38 T-5 BLUE (SUTURE) IMPLANT
SYR 30ML LL (SYRINGE) ×4 IMPLANT
SYR CONTROL 10ML LL (SYRINGE) IMPLANT
TAPE LABRALWHITE 1.5X36 (TAPE) ×2 IMPLANT
TAPE SUT LABRALTAP WHT/BLK (SUTURE) ×2 IMPLANT
TOWEL OR 17X24 6PK STRL BLUE (TOWEL DISPOSABLE) ×2 IMPLANT
TOWEL OR 17X26 10 PK STRL BLUE (TOWEL DISPOSABLE) ×2 IMPLANT
TOWER SMARTMIX MINI (MISCELLANEOUS) ×1 IMPLANT

## 2017-08-28 NOTE — Anesthesia Postprocedure Evaluation (Signed)
Anesthesia Post Note  Patient: Stephanie Savage  Procedure(s) Performed: TOTAL SHOULDER ARTHROPLASTY (Left Shoulder)     Patient location during evaluation: PACU Anesthesia Type: General Level of consciousness: awake and alert Pain management: pain level controlled Vital Signs Assessment: post-procedure vital signs reviewed and stable Respiratory status: spontaneous breathing, nonlabored ventilation and respiratory function stable Cardiovascular status: blood pressure returned to baseline and stable Postop Assessment: no apparent nausea or vomiting Anesthetic complications: no    Last Vitals:  Vitals:   08/28/17 1515 08/28/17 1545  BP:  122/78  Pulse: 71 73  Resp: 15 18  Temp: 36.7 C 36.8 C  SpO2: 99% 95%    Last Pain:  Vitals:   08/28/17 1745  TempSrc:   PainSc: 3                  Beryle Lathe

## 2017-08-28 NOTE — Op Note (Signed)
Procedure(s): TOTAL SHOULDER ARTHROPLASTY Procedure Note  JULL HARRAL female 66 y.o. 08/28/2017  Procedure(s) and Anesthesia Type:    * LEFT TOTAL SHOULDER ARTHROPLASTY - Choice      LEFT LONG HEAD BICEPS TENODESIS      Surgeon(s) and Role:    Jones Broom, MD - Primary   Indications:  66 y.o. female  With endstage left shoulder arthritis. Pain and dysfunction interfered with quality of life and nonoperative treatment with activity modification, NSAIDS and injections failed.     Surgeon: Mable Paris   Assistants: Damita Lack PA-C [facility] was present and scrubbed throughout the procedure and was essential in positioning, retraction, exposure, and closure)  Anesthesia: General endotracheal anesthesia with preoperative interscalene block given by the attending anesthesiologist    Procedure Detail  TOTAL SHOULDER ARTHROPLASTY  Findings: Tornier flex anatomic press-fit size 2 stem with a 46 head, cemented size 35 small perform plus augmented Cortiloc glenoid.   A lesser tuberosity osteotomy was performed and repaired at the conclusion of the procedure.  Estimated Blood Loss:  200 mL         Drains: None   Blood Given: none          Specimens: none        Complications:  * No complications entered in OR log *         Disposition: PACU - hemodynamically stable.         Condition: stable    Procedure:   The patient was identified in the preoperative holding area where I personally marked the operative extremity after verifying with the patient and consent. She  was taken to the operating room where She was transferred to the   operative table.  The patient received an interscalene block in   the holding area by the attending anesthesiologist.  General anesthesia was induced   in the operating room without complication.  The patient did receive IV  Ancef prior to the commencement of the procedure.  The patient was   placed in the  beach-chair position with the back raised about 30   degrees.  The nonoperative extremity and head and neck were carefully   positioned and padded protecting against neurovascular compromise.  The   left upper extremity was then prepped and draped in the standard sterile   fashion.    The appropriate operative time-out was performed with   Anesthesia, the perioperative staff, as well as myself and we all agreed   that the left side was the correct operative site.  The patient received 1 g IV tranexamic acid at the start of the case around time of the incision. An approximately   8 cm incision was made from the tip of the coracoid to the center point of the   humerus at the level of the axilla.  Dissection was carried down sharply   through subcutaneous tissues and cephalic vein was identified and taken   laterally with the deltoid.  The pectoralis major was taken medially.  The   upper 1 cm of the pectoralis major was released from its attachment on   the humerus.  The clavipectoral fascia was incised just lateral to the   conjoined tendon.  This incision was carried up to but not into the   coracoacromial ligament.  Digital palpation was used to prove   integrity of the axillary nerve which was protected throughout the   procedure.  Musculocutaneous nerve was not palpated in the operative  field.  Conjoined tendon was then retracted gently medially and the   deltoid laterally.  Anterior circumflex humeral vessels were clamped and   coagulated.  The soft tissues overlying the biceps was incised and this   incision was carried across the transverse humeral ligament to the base   of the coracoid.  The biceps was noted to be severely degenerated. It was released from the superior labrum. The biceps was then tenodesed to the soft tissue just above   pectoralis major and the remaining portion of the biceps superiorly was   excised.  An osteotomy was performed at the lesser tuberosity.  The  capsule was then   released all the way down to the 6 o'clock position of the humeral head.   The humeral head was then delivered with simultaneous adduction,   extension and external rotation.  All humeral osteophytes were removed   and the anatomic neck of the humerus was marked and cut free hand at   approximately 25 degrees retroversion within about 3 mm of the cuff   reflection posteriorly.  The head size was estimated to be a 46 medium   offset.  At that point, the humeral head was retracted posteriorly with   a Fukuda retractor.   Remaining portion of the capsule was released at the base of the   coracoid.  The remaining biceps anchor and the entire anterior-inferior   labrum was excised.  The posterior labrum was also excised but the   posterior capsule was not released.  She was noted to have significant retroversion of the glenoid with B2 morphology. The guidepin was placed bicortically correcting for the retroversion.  The reamer was used to ream to concentric bone on the anterior half of the glenoid to the level of the central pinwith punctate bleeding.  The derotational hole was then drilled. The angled posterior glenoid reamer was then advanced over the guidepin starting at 15 and advancing up to 35 in order to get concentric reaming. This gave an excellent concentric surfacewith the trial.  The center hole was then drilled for an anchor peg glenoid followed by the three peripheral holes and none of the holes   exited the glenoid wall.  I then pulse irrigated these holes and dried   them with Surgicel.  The three peripheral holes were then   pressurized cemented and the anchor peg glenoid was placed and impacted   with an excellent fit.  The glenoid was a perform +35 component.  The proximal humerus was then again exposed taking care not to displace the glenoid.    The entry awl was used followed by sounding reamers and then sequentially broached from size 1 to 2. This was then left  in place and the calcar planer was used. Trial head was placed with a 46.  With the trial implantation of the component,  there was approximately 50% posterior translation with immediate snap back to the   anatomic position.  With forward elevation, there was no tendency   towards posterior subluxation.   The trial was removed and the final implant was prepared on a back table.  The trial was removed and the final implant was prepared on a back table.   3 small holes were drilled on the medial side of the lesser tuberosity osteotomy, through which 2 labral tapes were passed. The implant was then placed through the loop of the 2 labral tapes and impacted with an excellent press-fit. This achieved excellent anatomic reconstruction  of the proximal humerus.  The joint was then copiously irrigated with pulse lavage.  The subscapularis and   lesser tuberosity osteotomy were then repaired using the 2 labral tapes previously passed in a double row fashion with horizontal mattress sutures medially brought over through bone tunnels tied over a bone bridge laterally.   One #1 Ethibond was placed at the rotator interval just above   the lesser tuberosity. Copious irrigation was used. Throughout the case a mixture of 20 mL liposomal bupivacaine and 40 mL normal saline was used to infiltrate the deep capsular tissue, bony surfaces and subcutaneous tissue. Skin was closed with 2-0 Vicryl sutures in the deep dermal layer and 4-0 Monocryl in a subcuticular  running fashion.  Sterile dressings were then applied including Aquacel.  The patient was placed in a sling and allowed to awaken from general anesthesia and taken to the recovery room in stable condition.      POSTOPERATIVE PLAN:  Early passive range of motion will be allowed with the goal of 0 degrees external rotation and 90 degrees forward elevation.  No internal rotation at this time.  No active motion of the arm until the lesser tuberosity heals.  The patient  will likely be kept in the hospital for 1-2 days and then discharged home.

## 2017-08-28 NOTE — H&P (Signed)
Stephanie Savage is an 66 y.o. female.   Chief Complaint: L shoulder pain and dysfunction HPI: Endstage L shoulder arthritis with significant pain and dysfunction, failed conservative measures.  Pain interferes with sleep and quality of life.   Past Medical History:  Diagnosis Date  . Allergy   . Anemia   . Anxiety   . Arthritis   . Asthma    allergy related   . Complication of anesthesia    "hypes me up"  . GERD (gastroesophageal reflux disease)   . Rectocele     Past Surgical History:  Procedure Laterality Date  . COLONOSCOPY    . ROTATOR CUFF REPAIR Right 07/22/2013   bone spurs; involved muscle  . shoulder Left [id number]  . SHOULDER ARTHROSCOPY Left   . TONSILLECTOMY    . TOTAL SHOULDER ARTHROPLASTY Right 04/18/2016   Procedure: TOTAL SHOULDER ARTHROPLASTY;  Surgeon: Tania Ade, MD;  Location: [facility];  Service: Orthopedics;  Laterality: Right;  Right total shoulder arthroplasty  . TOTAL SHOULDER REPLACEMENT Right 04/18/2016    Family History  Problem Relation Age of Onset  . Colon cancer Neg Hx   . Colon polyps Neg Hx    Social History:  reports that she has never smoked. She has never used smokeless tobacco. She reports that she drinks alcohol. She reports that she does not use drugs.  Allergies:  Allergies  Allergen Reactions  . Sulfa Antibiotics Hives  . Codeine Nausea Only  . Doxycycline Itching and Rash    Big red patches  . Latex Itching    Red skin    Medications Prior to Admission  Medication Sig Dispense Refill  . Biotin (BIOTIN 5000) 5 MG CAPS Take by mouth.    . calcium carbonate (OS-CAL - DOSED IN MG OF ELEMENTAL CALCIUM) 1250 (500 Ca) MG tablet Take 1 tablet by mouth 2 (two) times daily with a meal.    . celecoxib (CELEBREX) 200 MG capsule Take 200 mg by mouth daily as needed for mild pain.    . cholecalciferol (VITAMIN D) 1000 UNITS tablet Take 2,000 Units by mouth daily.     . diclofenac sodium (VOLTAREN) 1 % GEL Apply topically 2 (two)  times daily.    Marland Kitchen docusate sodium (COLACE) 100 MG capsule Take 1 capsule (100 mg total) by mouth 3 (three) times daily as needed. 20 capsule 0  . estradiol-norethindrone (ACTIVELLA) 1-0.5 MG per tablet Take 1 tablet by mouth daily.    . fexofenadine (ALLEGRA) 180 MG tablet Take 180 mg by mouth daily.    . fish oil-omega-3 fatty acids 1000 MG capsule Take 2 g by mouth daily.    . fluticasone (FLONASE) 50 MCG/ACT nasal spray Place 1 spray into both nostrils daily.    . methylphenidate (RITALIN) 20 MG tablet Take 20 mg by mouth 3 (three) times daily with meals.     . Multiple Vitamin (MULTIVITAMIN) capsule Take 1 capsule by mouth daily.    . niacin 100 MG tablet Take 100 mg by mouth at bedtime.    . Probiotic Product (PROBIOTIC DAILY PO) Take 1 tablet by mouth daily.     Marland Kitchen pyridOXINE (VITAMIN B-6) 100 MG tablet Take 100 mg by mouth daily.    Marland Kitchen oxyCODONE-acetaminophen (ROXICET) 5-325 MG tablet Take 1-2 tablets by mouth every 4 (four) hours as needed for severe pain. (Patient not taking: Reported on 08/26/2017) 60 tablet 0    Results for orders placed or performed during the hospital encounter of 08/26/17 (from  the past 48 hour(s))  Surgical pcr screen     Status: None   Collection Time: 08/26/17  9:31 AM  Result Value Ref Range   MRSA, PCR NEGATIVE NEGATIVE   Staphylococcus aureus NEGATIVE NEGATIVE    Comment: (NOTE) The Xpert SA Assay (FDA approved for NASAL specimens in patients 68 years of age and older), is one component of a comprehensive surveillance program. It is not intended to diagnose infection nor to guide or monitor treatment.   Urinalysis, Routine w reflex microscopic     Status: None   Collection Time: 08/26/17  9:31 AM  Result Value Ref Range   Color, Urine YELLOW YELLOW   APPearance CLEAR CLEAR   Specific Gravity, Urine 1.015 1.005 - 1.030   pH 5.0 5.0 - 8.0   Glucose, UA NEGATIVE NEGATIVE mg/dL   Hgb urine dipstick NEGATIVE NEGATIVE   Bilirubin Urine NEGATIVE NEGATIVE    Ketones, ur NEGATIVE NEGATIVE mg/dL   Protein, ur NEGATIVE NEGATIVE mg/dL   Nitrite NEGATIVE NEGATIVE   Leukocytes, UA NEGATIVE NEGATIVE  APTT     Status: None   Collection Time: 08/26/17  9:32 AM  Result Value Ref Range   aPTT 24 24 - 36 seconds  CBC WITH DIFFERENTIAL     Status: None   Collection Time: 08/26/17  9:32 AM  Result Value Ref Range   WBC 7.9 4.0 - 10.5 K/uL   RBC 4.45 3.87 - 5.11 MIL/uL   Hemoglobin 13.9 12.0 - 15.0 g/dL   HCT 42.1 36.0 - 46.0 %   MCV 94.6 78.0 - 100.0 fL   MCH 31.2 26.0 - 34.0 pg   MCHC 33.0 30.0 - 36.0 g/dL   RDW 13.2 11.5 - 15.5 %   Platelets 275 150 - 400 K/uL   Neutrophils Relative % 72 %   Neutro Abs 5.7 1.7 - 7.7 K/uL   Lymphocytes Relative 20 %   Lymphs Abs 1.6 0.7 - 4.0 K/uL   Monocytes Relative 6 %   Monocytes Absolute 0.5 0.1 - 1.0 K/uL   Eosinophils Relative 2 %   Eosinophils Absolute 0.1 0.0 - 0.7 K/uL   Basophils Relative 0 %   Basophils Absolute 0.0 0.0 - 0.1 K/uL  Comprehensive metabolic panel     Status: Abnormal   Collection Time: 08/26/17  9:32 AM  Result Value Ref Range   Sodium 138 135 - 145 mmol/L   Potassium 3.9 3.5 - 5.1 mmol/L   Chloride 104 101 - 111 mmol/L   CO2 27 22 - 32 mmol/L   Glucose, Bld 118 (H) 65 - 99 mg/dL   BUN 17 6 - 20 mg/dL   Creatinine, Ser 0.87 0.44 - 1.00 mg/dL   Calcium 9.7 8.9 - 10.3 mg/dL   Total Protein 6.1 (L) 6.5 - 8.1 g/dL   Albumin 3.7 3.5 - 5.0 g/dL   AST 38 15 - 41 U/L   ALT 34 14 - 54 U/L   Alkaline Phosphatase 67 38 - 126 U/L   Total Bilirubin 0.4 0.3 - 1.2 mg/dL   GFR calc non Af Amer >60 >60 mL/min   GFR calc Af Amer >60 >60 mL/min    Comment: (NOTE) The eGFR has been calculated using the CKD EPI equation. This calculation has not been validated in all clinical situations. eGFR's persistently <60 mL/min signify possible Chronic Kidney Disease.    Anion gap 7 5 - 15  Protime-INR     Status: None   Collection Time: 08/26/17  9:32  AM  Result Value Ref Range   Prothrombin  Time 12.9 11.4 - 15.2 seconds   INR 0.98    Dg Chest 2 View  Result Date: 08/26/2017 CLINICAL DATA:  Preop for left total shoulder replacement, former smoking history EXAM: CHEST  2 VIEW COMPARISON:  Chest x-ray of 04/12/2016 FINDINGS: The lungs are clear and slightly hyperaerated. Mediastinal and hilar contours are unremarkable. The heart is within normal limits in size. Mild thoracolumbar curvature is present. Right shoulder prosthesis is noted. IMPRESSION: No active cardiopulmonary disease. Electronically Signed   By: Ivar Drape M.D.   On: 08/26/2017 13:27    Review of Systems  All other systems reviewed and are negative.   Blood pressure 136/84, pulse 67, temperature 97.8 F (36.6 C), temperature source Oral, resp. rate 18, height 5' 4"  (1.626 m), weight 50.7 kg (111 lb 12.8 oz), SpO2 100 %. Physical Exam  Constitutional: She appears well-developed and well-nourished.  HENT:  Head: Atraumatic.  Eyes: EOM are normal.  Cardiovascular: Intact distal pulses.   Respiratory: Effort normal.  Musculoskeletal:  L shoulder pain with limited ROM. NVID  Skin: Skin is warm and dry.  Psychiatric: She has a normal mood and affect.     Assessment/Plan Endstage L shoulder arthritis with significant pain and dysfunction, failed conservative measures.  Pain interferes with sleep and quality of life. Risks / benefits of surgery discussed Consent on chart  NPO for OR Preop antibiotics    Nita Sells, MD 08/28/2017, 9:28 AM

## 2017-08-28 NOTE — Anesthesia Procedure Notes (Signed)
Procedure Name: Intubation Date/Time: 08/28/2017 10:16 AM Performed by: Freddie Breech Pre-anesthesia Checklist: Patient identified, Emergency Drugs available, Suction available and Patient being monitored Patient Re-evaluated:Patient Re-evaluated prior to induction Oxygen Delivery Method: Circle System Utilized Preoxygenation: Pre-oxygenation with 100% oxygen Induction Type: IV induction Ventilation: Mask ventilation without difficulty Laryngoscope Size: Mac and 3 Grade View: Grade I Tube type: Oral Number of attempts: 1 Airway Equipment and Method: Stylet and Oral airway Placement Confirmation: ETT inserted through vocal cords under direct vision,  positive ETCO2 and breath sounds checked- equal and bilateral Secured at: 21 cm Tube secured with: Tape Dental Injury: Teeth and Oropharynx as per pre-operative assessment

## 2017-08-28 NOTE — Transfer of Care (Signed)
Immediate Anesthesia Transfer of Care Note  Patient: Stephanie Savage  Procedure(s) Performed: TOTAL SHOULDER ARTHROPLASTY (Left Shoulder)  Patient Location: PACU  Anesthesia Type:GA combined with regional for post-op pain  Level of Consciousness: drowsy and patient cooperative  Airway & Oxygen Therapy: Patient Spontanous Breathing and Patient connected to face mask oxygen  Post-op Assessment: Report given to RN and Post -op Vital signs reviewed and stable  Post vital signs: Reviewed and stable  Last Vitals:  Vitals:   08/28/17 1250 08/28/17 1252  BP:    Pulse:    Resp: (P) 16   Temp: (!) (P) 36.3 C 36.6 C  SpO2:      Last Pain:  Vitals:   08/28/17 1252  TempSrc: Oral      Patients Stated Pain Goal: 2 (08/28/17 1610)  Complications: No apparent anesthesia complications

## 2017-08-28 NOTE — Progress Notes (Signed)
Report given to jamie hart rn as caregiver 

## 2017-08-28 NOTE — Discharge Instructions (Signed)

## 2017-08-28 NOTE — Progress Notes (Signed)
Spoke with Duwayne Heck, PA about patient going to [facility] instead of [facility].  She confirmed with Dr. Ave Filter, it is okay for patient to go to [facility].

## 2017-08-28 NOTE — Anesthesia Procedure Notes (Signed)
Anesthesia Regional Block: Interscalene brachial plexus block   Pre-Anesthetic Checklist: ,, timeout performed, Correct Patient, Correct Site, Correct Laterality, Correct Procedure, Correct Position, site marked, Risks and benefits discussed,  Surgical consent,  Pre-op evaluation,  At surgeon's request and post-op pain management  Laterality: Left  Prep: chloraprep       Needles:  Injection technique: Single-shot  Needle Type: Echogenic Needle     Needle Length: 5cm  Needle Gauge: 21     Additional Needles:   Narrative:  Start time: 08/28/2017 9:34 AM End time: 08/28/2017 9:38 AM Injection made incrementally with aspirations every 5 mL.  Performed by: Personally  Anesthesiologist: Leslye Peer E  Additional Notes: No pain on injection. No increased resistance to injection. Injection made in 5cc increments. Good needle visualization. Patient tolerated the procedure well.

## 2017-08-29 ENCOUNTER — Encounter (HOSPITAL_COMMUNITY): Payer: Self-pay | Admitting: Orthopedic Surgery

## 2017-08-29 LAB — BASIC METABOLIC PANEL
Anion gap: 4 — ABNORMAL LOW (ref 5–15)
BUN: 9 mg/dL (ref 6–20)
CO2: 26 mmol/L (ref 22–32)
Calcium: 8.3 mg/dL — ABNORMAL LOW (ref 8.9–10.3)
Chloride: 104 mmol/L (ref 101–111)
Creatinine, Ser: 0.76 mg/dL (ref 0.44–1.00)
GFR calc Af Amer: >60 mL/min (ref >60)
GFR calc non Af Amer: >60 mL/min (ref >60)
Glucose, Bld: 102 mg/dL — ABNORMAL HIGH (ref 65–99)
Potassium: 3.4 mmol/L — ABNORMAL LOW (ref 3.5–5.1)
Sodium: 134 mmol/L — ABNORMAL LOW (ref 135–145)

## 2017-08-29 LAB — CBC
HCT: 36.8 % (ref 36.0–46.0)
Hemoglobin: 12 g/dL (ref 12.0–15.0)
MCH: 30.8 pg (ref 26.0–34.0)
MCHC: 32.6 g/dL (ref 30.0–36.0)
MCV: 94.6 fL (ref 78.0–100.0)
Platelets: 228 10*3/uL (ref 150–400)
RBC: 3.89 MIL/uL (ref 3.87–5.11)
RDW: 13.5 % (ref 11.5–15.5)
WBC: 8.6 10*3/uL (ref 4.0–10.5)

## 2017-08-29 MED ORDER — POTASSIUM CHLORIDE 20 MEQ PO PACK
20.0000 meq | PACK | Freq: Two times a day (BID) | ORAL | Status: DC
  Filled 2017-08-29: qty 1

## 2017-08-29 MED ORDER — OXYCODONE-ACETAMINOPHEN 5-325 MG PO TABS: 1.0000 | ORAL_TABLET | ORAL | 0 refills | Status: DC | PRN

## 2017-08-29 NOTE — Evaluation (Signed)
Occupational Therapy Evaluation and Discharge Patient Details Name: Stephanie Savage MRN: [id number] DOB: 03-15-1951 Today's Date: 08/29/2017    History of Present Illness LEFT TOTAL SHOULDER ARTHROPLASTY ; PMHx RTSA a year ago   Clinical Impression   This 66 yo female admitted and underwent above presents to acute OT with all education complete and post op shoulder handouts provided. No further acute OT we will D/C pt.    Follow Up Recommendations   (follow up per MD)    Equipment Recommendations  None recommended by OT       Precautions / Restrictions Precautions Precautions: Shoulder Shoulder Interventions: Shoulder sling/immobilizer;Off for dressing/bathing/exercises Precaution Comments: Passive protocol (FF 90 degrees, ER 0 degrees (neutral), no Abduction), no pendulums; elbow, wrist, hand exercises OK Required Braces or Orthoses: Sling Restrictions Weight Bearing Restrictions: Yes LUE Weight Bearing: Non weight bearing      Mobility Bed Mobility Overal bed mobility: Independent                Transfers Overall transfer level: Independent                        ADL either performed or assessed with clinical judgement         Vision Patient Visual Report: No change from baseline              Pertinent Vitals/Pain Pain Assessment: 0-10 Pain Score: 8  Pain Location: left shoulder Pain Descriptors / Indicators: Aching;Sore Pain Intervention(s): Limited activity within patient's tolerance;Repositioned;Monitored during session     Hand Dominance Right   Extremity/Trunk Assessment Upper Extremity Assessment Upper Extremity Assessment: LUE deficits/detail LUE Deficits / Details: shoulder sx this admission, elbow distally WNL LUE Coordination: decreased gross motor           Communication Communication Communication: No difficulties   Cognition Arousal/Alertness: Awake/alert Behavior During Therapy: WFL for tasks  assessed/performed Overall Cognitive Status: Within Functional Limits for tasks assessed                                        Exercises Other Exercises Other Exercises: pt able to do elbow, forearm, wrist, and hand exercises 10 reps each. I performed PROM for shoulde flexion (pt able to achieve 60 degrees in supine), I performed external rotation ( pt able to achieve -50 degrees external rotation from neutral) while standing--aware no more than neutral.  Pt has video of me helping her with these two exercises.   Shoulder Instructions Shoulder Instructions Donning/doffing shirt without moving shoulder:  (pt reports she donned shirt without moving shoulder; reported she put LUE in first then head then RUE; aware she reverses process for doffing shirt) Method for sponge bathing under operated UE:  (pt verbalizes understanding to lean to left gravity move arm away from body and not actively abduct) Donning/doffing sling/immobilizer: Minimal assistance Correct positioning of sling/immobilizer: Independent Pendulum exercises (written home exercise program):  (NA) ROM for elbow, wrist and digits of operated UE: Independent Sling wearing schedule (on at all times/off for ADL's): Independent Proper positioning of operated UE when showering: Independent Dressing change:  (NA) Positioning of UE while sleeping:  (verbalizes understanding, knows not to abduct arm and prop on pillow)    Home Living Family/patient expects to be discharged to:: Private residence Living Arrangements: Spouse/significant other Available Help at Discharge: Family;Available PRN/intermittently  Home Equipment: None          Prior Functioning/Environment Level of Independence: Independent                 OT Problem List: Decreased range of motion;Impaired UE functional use;Pain         OT Goals(Current goals can be found in the care plan section) Acute Rehab OT  Goals Patient Stated Goal: home today  OT Frequency:                AM-PAC PT "6 Clicks" Daily Activity     Outcome Measure Help from another person eating meals?: None Help from another person taking care of personal grooming?: None Help from another person toileting, which includes using toliet, bedpan, or urinal?: None Help from another person bathing (including washing, rinsing, drying)?: A Little Help from another person to put on and taking off regular upper body clothing?: None Help from another person to put on and taking off regular lower body clothing?: A Little 6 Click Score: 22   End of Session Equipment Utilized During Treatment:  (sling) Nurse Communication:  (pt ready to go from OT standpoint)  Activity Tolerance: Patient tolerated treatment well Patient left:  (sitting EOB waiting to be D/C'd)  OT Visit Diagnosis: Pain Pain - Right/Left: Left Pain - part of body: Shoulder                Time: 1100-1120 OT Time Calculation (min): 20 min Charges:  OT General Charges $OT Visit: 1 Visit OT Evaluation $OT Eval Moderate Complexity: [address] Ignacia Palma, [place] [phone] 08/29/2017

## 2017-08-29 NOTE — Progress Notes (Signed)
   PATIENT ID: Stephanie Savage   1 Day Post-Op Procedure(s) (LRB): TOTAL SHOULDER ARTHROPLASTY (Left)  Subjective: Doing well, had some discomfort overnight, possible incomplete block. Doing better this am. Feels okay to d/c today.   Objective:  Vitals:   08/29/17 0400 08/29/17 0757  BP: (!) 149/87 117/69  Pulse: 68 66  Resp: 18 18  Temp: 98.3 F (36.8 C) 98 F (36.7 C)  SpO2: 97% 99%     L UE dressing w scant blood Intact Wiggles fingers, distally NVI  Labs:   Recent Labs  08/26/17 0932 08/29/17 0621  HGB 13.9 12.0   Recent Labs  08/26/17 0932 08/29/17 0621  WBC 7.9 8.6  RBC 4.45 3.89  HCT 42.1 36.8  PLT 275 228   Recent Labs  08/26/17 0932 08/29/17 0621  NA 138 134*  K 3.9 3.4*  CL 104 104  CO2 27 26  BUN 17 9  CREATININE 0.87 0.76  GLUCOSE 118* 102*  CALCIUM 9.7 8.3*    Assessment and Plan: 1 day s/p l TSA OT- PROM to 90 FF 0 ER K a little low and will give Kcl supplement D/c home today when cleared by PT Script signed Fu w Dr. Ave Filter in 2 weeks  VTE proph: asa,scds

## 2017-08-29 NOTE — Progress Notes (Signed)
Patient is discharged from room 3C05 at this time. Alert and in stable condition. IV site d/c'd and instructions read to patient and spouse with understanding verbalized. Left unit via wheelchair with all belongings at side. 

## 2017-08-29 NOTE — Discharge Summary (Signed)
Patient ID: Stephanie Savage MRN: [id number] DOB/AGE: 1950-12-27 66 y.o.  Admit date: 08/28/2017 Discharge date: 08/29/2017  Admission Diagnoses:  Active Problems:   S/P shoulder replacement, left   Discharge Diagnoses:  Same  Past Medical History:  Diagnosis Date  . Allergy   . Anemia   . Anxiety   . Arthritis   . Asthma    allergy related   . Complication of anesthesia    "hypes me up"  . GERD (gastroesophageal reflux disease)   . Rectocele     Surgeries: Procedure(s): TOTAL SHOULDER ARTHROPLASTY on 08/28/2017   Consultants:   Discharged Condition: Improved  Hospital Course: Stephanie Savage is an 66 y.o. female who was admitted 08/28/2017 for operative treatment of left shoulder end stage OA. Patient has severe unremitting pain that affects sleep, daily activities, and work/hobbies. After pre-op clearance the patient was taken to the operating room on 08/28/2017 and underwent  Procedure(s): TOTAL SHOULDER ARTHROPLASTY.    Patient was given perioperative antibiotics: Anti-infectives    Start     Dose/Rate Route Frequency Ordered Stop   08/28/17 1700  ceFAZolin (ANCEF) IVPB 2g/100 mL premix     2 g 200 mL/hr over 30 Minutes Intravenous Every 6 hours 08/28/17 1533 08/29/17 0744   08/28/17 1000  ceFAZolin (ANCEF) IVPB 2g/100 mL premix     2 g 200 mL/hr over 30 Minutes Intravenous To [facility]Stay [facility] 08/27/17 1106 08/28/17 1024       Patient was given sequential compression devices, early ambulation, and asa to prevent DVT.  Patient benefited maximally from hospital stay and there were no complications.    Recent vital signs: Patient Vitals for the past 24 hrs:  BP Temp Temp src Pulse Resp SpO2 Height Weight  08/29/17 0757 117/69 98 F (36.7 C) Oral 66 18 99 % - -  08/29/17 0400 (!) 149/87 98.3 F (36.8 C) Oral 68 18 97 % - -  08/29/17 0015 110/75 98.2 F (36.8 C) Oral 74 16 97 % - -  08/28/17 2000 111/72 98 F (36.7 C) Oral 69 18 95 % - -  08/28/17 1545  122/78 98.3 F (36.8 C) - 73 18 95 % - -  08/28/17 1515 - 98 F (36.7 C) - 71 15 99 % - -  08/28/17 1500 - - - - 17 - - -  08/28/17 1445 110/71 - - 79 (!) 22 96 % - -  08/28/17 1430 - - - 74 13 98 % - -  08/28/17 1345 112/74 98 F (36.7 C) - 65 13 97 % - -  08/28/17 1330 124/77 - - 60 12 100 % - -  08/28/17 1315 130/86 - - 65 16 100 % - -  08/28/17 1300 140/77 - - 64 13 100 % - -  08/28/17 1252 - 97.9 F (36.6 C) Oral - - - - -  08/28/17 1250 (!) 141/83 (!) 97.4 F (36.3 C) - 76 16 100 % - -  08/28/17 0955 118/64 - - 71 19 100 % - -  08/28/17 0950 122/68 - - 69 13 100 % - -  08/28/17 0945 131/66 - - 71 13 100 % - -  08/28/17 0940 126/76 - - 75 15 100 % - -  08/28/17 0935 122/72 - - 68 (!) 21 100 % - -  08/28/17 0930 (!) 150/64 - - 72 20 100 % - -  08/28/17 0850 136/84 97.8 F (36.6 C) Oral 67 18 100 %  (1.626  m) 50.7 kg (111 lb 12.8 oz)     Recent laboratory studies:  Recent Labs  08/26/17 0932 08/29/17 0621  WBC 7.9 8.6  HGB 13.9 12.0  HCT 42.1 36.8  PLT 275 228  NA 138 134*  K 3.9 3.4*  CL 104 104  CO2 27 26  BUN 17 9  CREATININE 0.87 0.76  GLUCOSE 118* 102*  INR 0.98  --   CALCIUM 9.7 8.3*     Discharge Medications:   Allergies as of 08/29/2017      Reactions   Sulfa Antibiotics Hives   Codeine Nausea Only   Doxycycline Itching, Rash   Big red patches   Latex Itching   Red skin      Medication List    STOP taking these medications   celecoxib 200 MG capsule Commonly known as:  CELEBREX     TAKE these medications   BIOTIN 5000 5 MG Caps Generic drug:  Biotin Take by mouth.   calcium carbonate 1250 (500 Ca) MG tablet Commonly known as:  OS-CAL - dosed in mg of elemental calcium Take 1 tablet by mouth 2 (two) times daily with a meal.   cholecalciferol 1000 units tablet Commonly known as:  VITAMIN D Take 2,000 Units by mouth daily.   diclofenac sodium 1 % Gel Commonly known as:  VOLTAREN Apply topically 2 (two) times daily.    docusate sodium 100 MG capsule Commonly known as:  COLACE Take 1 capsule (100 mg total) by mouth 3 (three) times daily as needed.   estradiol-norethindrone 1-0.5 MG tablet Commonly known as:  ACTIVELLA Take 1 tablet by mouth daily.   fexofenadine 180 MG tablet Commonly known as:  ALLEGRA Take 180 mg by mouth daily.   fish oil-omega-3 fatty acids 1000 MG capsule Take 2 g by mouth daily.   fluticasone 50 MCG/ACT nasal spray Commonly known as:  FLONASE Place 1 spray into both nostrils daily.   methylphenidate 20 MG tablet Commonly known as:  RITALIN Take 20 mg by mouth 3 (three) times daily with meals.   multivitamin capsule Take 1 capsule by mouth daily.   niacin 100 MG tablet Take 100 mg by mouth at bedtime.   oxyCODONE-acetaminophen 5-325 MG tablet Commonly known as:  ROXICET Take 1-2 tablets by mouth every 4 (four) hours as needed for severe pain.   PROBIOTIC DAILY PO Take 1 tablet by mouth daily.   pyridOXINE 100 MG tablet Commonly known as:  VITAMIN B-6 Take 100 mg by mouth daily.       Diagnostic Studies: Dg Chest 2 View  Result Date: 08/26/2017 CLINICAL DATA:  Preop for left total shoulder replacement, former smoking history EXAM: CHEST  2 VIEW COMPARISON:  Chest x-ray of 04/12/2016 FINDINGS: The lungs are clear and slightly hyperaerated. Mediastinal and hilar contours are unremarkable. The heart is within normal limits in size. Mild thoracolumbar curvature is present. Right shoulder prosthesis is noted. IMPRESSION: No active cardiopulmonary disease. Electronically Signed   By: Dwyane Dee M.D.   On: 08/26/2017 13:27   Ct Shoulder Left Wo Contrast  Result Date: 08/22/2017 CLINICAL DATA:  Preoperative planning for total left shoulder arthroplasty. EXAM: CT OF THE UPPER LEFT EXTREMITY WITHOUT CONTRAST TECHNIQUE: Multidetector CT imaging of the upper left extremity was performed according to the standard protocol. COMPARISON:  None. FINDINGS: Severe/advanced  glenohumeral joint degenerative changes with full-thickness cartilage loss, marked joint space narrowing, osteophytic spurring and subchondral cystic change. No significant widening or thinning of the glenoid. No  fracture or osteochondral lesion. Suspect prior shoulder decompression with distal clavicle resection and acromioplasty. The rotator cuff tendons are grossly intact by CT. The visualized left ribs appear normal and the visualized left lung appears clear. IMPRESSION: 1. Severe/advanced left glenohumeral joint degenerative changes as described above. 2. Suspect prior shoulder decompression with distal clavicle resection and acromioplasty. 3. The rotator cuff tendons are grossly intact by CT. Electronically Signed   By: Rudie Meyer M.D.   On: 08/22/2017 15:43   Dg Shoulder Left Port  Result Date: 08/28/2017 CLINICAL DATA:  Status post left shoulder replacement. EXAM: LEFT SHOULDER - 1 VIEW COMPARISON:  CT scan of August 22, 2017. FINDINGS: Status post left shoulder arthroplasty. Prosthesis appears to be well situated. Expected soft tissue gas is seen in the surrounding soft tissues. IMPRESSION: Status post left shoulder arthroplasty. Electronically Signed   By: Lupita Raider, M.D.   On: 08/28/2017 13:12   Mm Digital Screening Bilateral  Result Date: 08/07/2017 CLINICAL DATA:  Screening. EXAM: DIGITAL SCREENING BILATERAL MAMMOGRAM WITH CAD COMPARISON:  Previous exam(s). ACR Breast Density Category c: The breast tissue is heterogeneously dense, which may obscure small masses. FINDINGS: There are no findings suspicious for malignancy. Images were processed with CAD. IMPRESSION: No mammographic evidence of malignancy. A result letter of this screening mammogram will be mailed directly to the patient. RECOMMENDATION: Screening mammogram in one year. (Code:[id number]) BI-RADS CATEGORY  1: Negative. Electronically Signed   By: Ted Mcalpine M.D.   On: 08/07/2017 14:39    Disposition: 01-Home or  Self Care  Discharge Instructions    Call MD / Call 911    Complete by:  As directed    If you experience chest pain or shortness of breath, CALL 911 and be transported to the hospital emergency room.  If you develope a fever above 101 F, pus (white drainage) or increased drainage or redness at the wound, or calf pain, call your surgeon's office.   Constipation Prevention    Complete by:  As directed    Drink plenty of fluids.  Prune juice may be helpful.  You may use a stool softener, such as Colace (over the counter) 100 mg twice a day.  Use MiraLax (over the counter) for constipation as needed.   Diet - low sodium heart healthy    Complete by:  As directed    Increase activity slowly as tolerated    Complete by:  As directed       Follow-up Information    Jones Broom, MD. Schedule an appointment as soon as possible for a visit in 2 weeks.   Specialty:  Orthopedic Surgery Contact information: [address] [place] [address] [phone]            Signed: Jiles Harold 08/29/2017, 8:12 AM

## 2017-09-10 DIAGNOSIS — Z471 Aftercare following joint replacement surgery: Secondary | ICD-10-CM | POA: Diagnosis not present

## 2017-09-10 DIAGNOSIS — Z96612 Presence of left artificial shoulder joint: Secondary | ICD-10-CM | POA: Diagnosis not present

## 2017-09-10 DIAGNOSIS — M19012 Primary osteoarthritis, left shoulder: Secondary | ICD-10-CM | POA: Diagnosis not present

## 2017-10-08 DIAGNOSIS — Z96612 Presence of left artificial shoulder joint: Secondary | ICD-10-CM | POA: Diagnosis not present

## 2017-10-08 DIAGNOSIS — Z471 Aftercare following joint replacement surgery: Secondary | ICD-10-CM | POA: Diagnosis not present

## 2017-10-08 DIAGNOSIS — M19012 Primary osteoarthritis, left shoulder: Secondary | ICD-10-CM | POA: Diagnosis not present

## 2017-10-14 DIAGNOSIS — M25512 Pain in left shoulder: Secondary | ICD-10-CM | POA: Diagnosis not present

## 2017-10-14 DIAGNOSIS — Z96612 Presence of left artificial shoulder joint: Secondary | ICD-10-CM | POA: Diagnosis not present

## 2017-10-14 DIAGNOSIS — M25612 Stiffness of left shoulder, not elsewhere classified: Secondary | ICD-10-CM | POA: Diagnosis not present

## 2017-10-21 DIAGNOSIS — M25612 Stiffness of left shoulder, not elsewhere classified: Secondary | ICD-10-CM | POA: Diagnosis not present

## 2017-10-21 DIAGNOSIS — M25512 Pain in left shoulder: Secondary | ICD-10-CM | POA: Diagnosis not present

## 2017-10-21 DIAGNOSIS — Z96612 Presence of left artificial shoulder joint: Secondary | ICD-10-CM | POA: Diagnosis not present

## 2017-10-23 DIAGNOSIS — M25612 Stiffness of left shoulder, not elsewhere classified: Secondary | ICD-10-CM | POA: Diagnosis not present

## 2017-10-23 DIAGNOSIS — M25512 Pain in left shoulder: Secondary | ICD-10-CM | POA: Diagnosis not present

## 2017-10-23 DIAGNOSIS — Z96612 Presence of left artificial shoulder joint: Secondary | ICD-10-CM | POA: Diagnosis not present

## 2017-10-28 DIAGNOSIS — M25612 Stiffness of left shoulder, not elsewhere classified: Secondary | ICD-10-CM | POA: Diagnosis not present

## 2017-10-28 DIAGNOSIS — M25512 Pain in left shoulder: Secondary | ICD-10-CM | POA: Diagnosis not present

## 2017-10-29 DIAGNOSIS — H04123 Dry eye syndrome of bilateral lacrimal glands: Secondary | ICD-10-CM | POA: Diagnosis not present

## 2017-10-30 DIAGNOSIS — M25612 Stiffness of left shoulder, not elsewhere classified: Secondary | ICD-10-CM | POA: Diagnosis not present

## 2017-10-30 DIAGNOSIS — M25512 Pain in left shoulder: Secondary | ICD-10-CM | POA: Diagnosis not present

## 2017-11-06 DIAGNOSIS — M25512 Pain in left shoulder: Secondary | ICD-10-CM | POA: Diagnosis not present

## 2017-11-06 DIAGNOSIS — M25612 Stiffness of left shoulder, not elsewhere classified: Secondary | ICD-10-CM | POA: Diagnosis not present

## 2017-11-10 DIAGNOSIS — Z01419 Encounter for gynecological examination (general) (routine) without abnormal findings: Secondary | ICD-10-CM | POA: Diagnosis not present

## 2017-11-11 DIAGNOSIS — M25612 Stiffness of left shoulder, not elsewhere classified: Secondary | ICD-10-CM | POA: Diagnosis not present

## 2017-11-11 DIAGNOSIS — M25512 Pain in left shoulder: Secondary | ICD-10-CM | POA: Diagnosis not present

## 2017-11-21 DIAGNOSIS — Z9889 Other specified postprocedural states: Secondary | ICD-10-CM | POA: Diagnosis not present

## 2017-11-27 DIAGNOSIS — M25612 Stiffness of left shoulder, not elsewhere classified: Secondary | ICD-10-CM | POA: Diagnosis not present

## 2017-11-27 DIAGNOSIS — M25512 Pain in left shoulder: Secondary | ICD-10-CM | POA: Diagnosis not present

## 2017-12-02 DIAGNOSIS — M25612 Stiffness of left shoulder, not elsewhere classified: Secondary | ICD-10-CM | POA: Diagnosis not present

## 2017-12-02 DIAGNOSIS — M25512 Pain in left shoulder: Secondary | ICD-10-CM | POA: Diagnosis not present

## 2017-12-04 DIAGNOSIS — M25612 Stiffness of left shoulder, not elsewhere classified: Secondary | ICD-10-CM | POA: Diagnosis not present

## 2017-12-04 DIAGNOSIS — M25512 Pain in left shoulder: Secondary | ICD-10-CM | POA: Diagnosis not present

## 2017-12-09 DIAGNOSIS — M25512 Pain in left shoulder: Secondary | ICD-10-CM | POA: Diagnosis not present

## 2017-12-09 DIAGNOSIS — M25612 Stiffness of left shoulder, not elsewhere classified: Secondary | ICD-10-CM | POA: Diagnosis not present

## 2017-12-11 DIAGNOSIS — M25512 Pain in left shoulder: Secondary | ICD-10-CM | POA: Diagnosis not present

## 2017-12-11 DIAGNOSIS — M25612 Stiffness of left shoulder, not elsewhere classified: Secondary | ICD-10-CM | POA: Diagnosis not present

## 2018-01-26 DIAGNOSIS — F908 Attention-deficit hyperactivity disorder, other type: Secondary | ICD-10-CM | POA: Diagnosis not present

## 2018-02-18 DIAGNOSIS — Z96612 Presence of left artificial shoulder joint: Secondary | ICD-10-CM | POA: Diagnosis not present

## 2018-02-18 DIAGNOSIS — M25512 Pain in left shoulder: Secondary | ICD-10-CM | POA: Diagnosis not present

## 2018-02-18 DIAGNOSIS — Z09 Encounter for follow-up examination after completed treatment for conditions other than malignant neoplasm: Secondary | ICD-10-CM | POA: Diagnosis not present

## 2018-02-26 DIAGNOSIS — R82998 Other abnormal findings in urine: Secondary | ICD-10-CM | POA: Diagnosis not present

## 2018-02-26 DIAGNOSIS — Z0000 Encounter for general adult medical examination without abnormal findings: Secondary | ICD-10-CM | POA: Diagnosis not present

## 2018-03-05 DIAGNOSIS — F900 Attention-deficit hyperactivity disorder, predominantly inattentive type: Secondary | ICD-10-CM | POA: Diagnosis not present

## 2018-03-05 DIAGNOSIS — J309 Allergic rhinitis, unspecified: Secondary | ICD-10-CM | POA: Diagnosis not present

## 2018-03-05 DIAGNOSIS — R030 Elevated blood-pressure reading, without diagnosis of hypertension: Secondary | ICD-10-CM | POA: Diagnosis not present

## 2018-03-05 DIAGNOSIS — I7300 Raynaud's syndrome without gangrene: Secondary | ICD-10-CM | POA: Diagnosis not present

## 2018-03-05 DIAGNOSIS — D692 Other nonthrombocytopenic purpura: Secondary | ICD-10-CM | POA: Diagnosis not present

## 2018-03-05 DIAGNOSIS — Z0000 Encounter for general adult medical examination without abnormal findings: Secondary | ICD-10-CM | POA: Diagnosis not present

## 2018-03-05 DIAGNOSIS — Z1389 Encounter for screening for other disorder: Secondary | ICD-10-CM | POA: Diagnosis not present

## 2018-03-05 DIAGNOSIS — L309 Dermatitis, unspecified: Secondary | ICD-10-CM | POA: Diagnosis not present

## 2018-03-05 DIAGNOSIS — E785 Hyperlipidemia, unspecified: Secondary | ICD-10-CM | POA: Diagnosis not present

## 2018-03-05 DIAGNOSIS — Z6820 Body mass index (BMI) 20.0-20.9, adult: Secondary | ICD-10-CM | POA: Diagnosis not present

## 2018-07-10 DIAGNOSIS — N816 Rectocele: Secondary | ICD-10-CM | POA: Diagnosis not present

## 2018-07-30 ENCOUNTER — Other Ambulatory Visit: Payer: Self-pay | Admitting: Obstetrics and Gynecology

## 2018-08-03 NOTE — Patient Instructions (Addendum)
Your procedure is scheduled on: Tuesday, 9/17  Enter through the Main Entrance of [facility] at: 11:45 am  Pick up the phone at the desk and dial [phone].  Call this number if you have problems the morning of surgery: [phone].  Remember: Do NOT eat food after midnight Monday.  Do NOT drink clear liquids (including water) after: 7 am Tuesday, day of surgery.  Take these medicines the morning of surgery with a SIP OF WATER: hormone pill, allegra and flonase nasal spray if needed  Brush your teeth on the day of surgery.  Stop herbal medications, vitamin supplements, Ibuprofen/NSAIDS at this time.  Do NOT wear jewelry (body piercing), metal hair clips/bobby pins, make-up, or nail polish. Do NOT wear lotions, powders, or perfumes.  You may wear deoderant. Do NOT shave for 48 hours prior to surgery. Do NOT bring valuables to the hospital. Contacts may not be worn into surgery.  Have a responsible adult drive you home and stay with you for 24 hours after your procedure. Home with husband Casimiro Needle cell [phone].

## 2018-08-04 DIAGNOSIS — F908 Attention-deficit hyperactivity disorder, other type: Secondary | ICD-10-CM | POA: Diagnosis not present

## 2018-08-07 ENCOUNTER — Other Ambulatory Visit: Payer: Self-pay

## 2018-08-07 ENCOUNTER — Encounter (HOSPITAL_COMMUNITY)
Admission: RE | Admit: 2018-08-07 | Discharge: 2018-08-07 | Disposition: A | Discharge status: Home / Self Care | Payer: PPO | Source: Ambulatory Visit | Attending: Obstetrics and Gynecology | Admitting: Obstetrics and Gynecology

## 2018-08-07 ENCOUNTER — Encounter (HOSPITAL_COMMUNITY): Payer: Self-pay

## 2018-08-07 DIAGNOSIS — K219 Gastro-esophageal reflux disease without esophagitis: Secondary | ICD-10-CM | POA: Diagnosis not present

## 2018-08-07 DIAGNOSIS — D649 Anemia, unspecified: Secondary | ICD-10-CM | POA: Diagnosis not present

## 2018-08-07 DIAGNOSIS — Z01812 Encounter for preprocedural laboratory examination: Secondary | ICD-10-CM | POA: Insufficient documentation

## 2018-08-07 LAB — CBC
HCT: 43.4 % (ref 36.0–46.0)
Hemoglobin: 14.5 g/dL (ref 12.0–15.0)
MCH: 32.1 pg (ref 26.0–34.0)
MCHC: 33.4 g/dL (ref 30.0–36.0)
MCV: 96 fL (ref 78.0–100.0)
Platelets: 281 10*3/uL (ref 150–400)
RBC: 4.52 MIL/uL (ref 3.87–5.11)
RDW: 13.3 % (ref 11.5–15.5)
WBC: 7.4 10*3/uL (ref 4.0–10.5)

## 2018-08-10 NOTE — Anesthesia Preprocedure Evaluation (Addendum)
Anesthesia Evaluation  Patient identified by MRN, date of birth, ID band Patient awake    Reviewed: Allergy & Precautions, NPO status , Patient's Chart, lab work & pertinent test results  Airway Mallampati: II  TM Distance: >3 FB Neck ROM: Full    Dental no notable dental hx. (+) Caps, Teeth Intact, Dental Advisory Given   Pulmonary neg pulmonary ROS,    Pulmonary exam normal breath sounds clear to auscultation       Cardiovascular negative cardio ROS Normal cardiovascular exam Rhythm:Regular Rate:Normal     Neuro/Psych negative neurological ROS     GI/Hepatic GERD  ,  Endo/Other    Renal/GU      Musculoskeletal  (+) Arthritis ,   Abdominal   Peds  Hematology  (+) anemia ,   Anesthesia Other Findings   Reproductive/Obstetrics                           Lab Results  Component Value Date   WBC 7.4 08/07/2018   HGB 14.5 08/07/2018   HCT 43.4 08/07/2018   MCV 96.0 08/07/2018   PLT 281 08/07/2018   Anesthesia Physical Anesthesia Plan  ASA: II  Anesthesia Plan: General   Post-op Pain Management:    Induction: Intravenous  PONV Risk Score and Plan: 4 or greater and Treatment may vary due to age or medical condition, Ondansetron and Dexamethasone  Airway Management Planned: Oral ETT  Additional Equipment:   Intra-op Plan:   Post-operative Plan: Extubation in OR  Informed Consent: I have reviewed the patients History and Physical, chart, labs and discussed the procedure including the risks, benefits and alternatives for the proposed anesthesia with the patient or authorized representative who has indicated his/her understanding and acceptance.   Dental advisory given  Plan Discussed with:   Anesthesia Plan Comments:         Anesthesia Quick Evaluation

## 2018-08-11 ENCOUNTER — Ambulatory Visit (HOSPITAL_COMMUNITY): Payer: PPO | Admitting: Anesthesiology

## 2018-08-11 ENCOUNTER — Other Ambulatory Visit: Payer: Self-pay

## 2018-08-11 ENCOUNTER — Ambulatory Visit (HOSPITAL_COMMUNITY)
Admission: RE | Admit: 2018-08-11 | Discharge: 2018-08-11 | Disposition: A | Discharge status: Home / Self Care | Payer: PPO | Source: Ambulatory Visit | Attending: Obstetrics and Gynecology | Admitting: Obstetrics and Gynecology

## 2018-08-11 ENCOUNTER — Encounter (HOSPITAL_COMMUNITY)
Admission: RE | Disposition: A | Discharge status: Home / Self Care | Payer: Self-pay | Source: Ambulatory Visit | Attending: Obstetrics and Gynecology

## 2018-08-11 ENCOUNTER — Encounter (HOSPITAL_COMMUNITY): Payer: Self-pay | Admitting: *Deleted

## 2018-08-11 DIAGNOSIS — N8189 Other female genital prolapse: Secondary | ICD-10-CM | POA: Diagnosis not present

## 2018-08-11 DIAGNOSIS — Z882 Allergy status to sulfonamides status: Secondary | ICD-10-CM | POA: Diagnosis not present

## 2018-08-11 DIAGNOSIS — N815 Vaginal enterocele: Secondary | ICD-10-CM | POA: Diagnosis not present

## 2018-08-11 DIAGNOSIS — K219 Gastro-esophageal reflux disease without esophagitis: Secondary | ICD-10-CM | POA: Insufficient documentation

## 2018-08-11 DIAGNOSIS — N816 Rectocele: Secondary | ICD-10-CM | POA: Diagnosis not present

## 2018-08-11 DIAGNOSIS — F419 Anxiety disorder, unspecified: Secondary | ICD-10-CM | POA: Insufficient documentation

## 2018-08-11 DIAGNOSIS — Z79899 Other long term (current) drug therapy: Secondary | ICD-10-CM | POA: Diagnosis not present

## 2018-08-11 HISTORY — PX: RECTOCELE REPAIR: SHX761

## 2018-08-11 SURGERY — COLPORRHAPHY, POSTERIOR, FOR RECTOCELE REPAIR
Anesthesia: General

## 2018-08-11 MED ORDER — FENTANYL CITRATE (PF) 100 MCG/2ML IJ SOLN
INTRAMUSCULAR | Status: AC
  Filled 2018-08-11: qty 2

## 2018-08-11 MED ORDER — CEFAZOLIN SODIUM-DEXTROSE 2-4 GM/100ML-% IV SOLN
2.0000 g | INTRAVENOUS | Status: AC
  Administered 2018-08-11: 2 g via INTRAVENOUS

## 2018-08-11 MED ORDER — PROMETHAZINE HCL 25 MG/ML IJ SOLN: 6.2500 mg | INTRAMUSCULAR | Status: DC | PRN

## 2018-08-11 MED ORDER — MEPERIDINE HCL 25 MG/ML IJ SOLN: 6.2500 mg | INTRAMUSCULAR | Status: DC | PRN

## 2018-08-11 MED ORDER — ACETAMINOPHEN 10 MG/ML IV SOLN: 1000.0000 mg | Freq: Once | INTRAVENOUS | Status: DC | PRN

## 2018-08-11 MED ORDER — FENTANYL CITRATE (PF) 100 MCG/2ML IJ SOLN
INTRAMUSCULAR | Status: DC | PRN
  Administered 2018-08-11: 25 ug via INTRAVENOUS

## 2018-08-11 MED ORDER — DEXAMETHASONE SODIUM PHOSPHATE 10 MG/ML IJ SOLN
INTRAMUSCULAR | Status: DC | PRN
  Administered 2018-08-11: 4 mg via INTRAVENOUS

## 2018-08-11 MED ORDER — PROPOFOL 10 MG/ML IV BOLUS
INTRAVENOUS | Status: DC | PRN
  Administered 2018-08-11: 170 mg via INTRAVENOUS

## 2018-08-11 MED ORDER — HYDROMORPHONE HCL 1 MG/ML IJ SOLN: 0.2500 mg | INTRAMUSCULAR | Status: DC | PRN

## 2018-08-11 MED ORDER — DEXAMETHASONE SODIUM PHOSPHATE 4 MG/ML IJ SOLN
INTRAMUSCULAR | Status: AC
  Filled 2018-08-11: qty 1

## 2018-08-11 MED ORDER — BUPIVACAINE-EPINEPHRINE (PF) 0.25% -1:200000 IJ SOLN
INTRAMUSCULAR | Status: DC | PRN
  Administered 2018-08-11: 10 mL

## 2018-08-11 MED ORDER — ACETAMINOPHEN 500 MG PO TABS
ORAL_TABLET | ORAL | Status: AC
  Administered 2018-08-11: 1000 mg via ORAL
  Filled 2018-08-11: qty 2

## 2018-08-11 MED ORDER — LACTATED RINGERS IV SOLN
INTRAVENOUS | Status: DC
  Administered 2018-08-11: 12:00:00 via INTRAVENOUS

## 2018-08-11 MED ORDER — BUPIVACAINE-EPINEPHRINE (PF) 0.25% -1:200000 IJ SOLN
INTRAMUSCULAR | Status: AC
  Filled 2018-08-11: qty 30

## 2018-08-11 MED ORDER — LIDOCAINE HCL (CARDIAC) PF 100 MG/5ML IV SOSY
PREFILLED_SYRINGE | INTRAVENOUS | Status: AC
  Filled 2018-08-11: qty 5

## 2018-08-11 MED ORDER — PROPOFOL 10 MG/ML IV BOLUS
INTRAVENOUS | Status: AC
  Filled 2018-08-11: qty 20

## 2018-08-11 MED ORDER — GABAPENTIN 300 MG PO CAPS
ORAL_CAPSULE | ORAL | Status: AC
  Administered 2018-08-11: 300 mg via ORAL
  Filled 2018-08-11: qty 1

## 2018-08-11 MED ORDER — CEFAZOLIN SODIUM-DEXTROSE 2-4 GM/100ML-% IV SOLN
INTRAVENOUS | Status: AC
  Filled 2018-08-11: qty 100

## 2018-08-11 MED ORDER — GABAPENTIN 300 MG PO CAPS
300.0000 mg | ORAL_CAPSULE | Freq: Once | ORAL | Status: AC
  Administered 2018-08-11: 300 mg via ORAL

## 2018-08-11 MED ORDER — MIDAZOLAM HCL 2 MG/2ML IJ SOLN
INTRAMUSCULAR | Status: AC
  Filled 2018-08-11: qty 2

## 2018-08-11 MED ORDER — ONDANSETRON HCL 4 MG/2ML IJ SOLN
INTRAMUSCULAR | Status: AC
  Filled 2018-08-11: qty 2

## 2018-08-11 MED ORDER — LIDOCAINE HCL (CARDIAC) PF 100 MG/5ML IV SOSY
PREFILLED_SYRINGE | INTRAVENOUS | Status: DC | PRN
  Administered 2018-08-11: 80 mg via INTRAVENOUS

## 2018-08-11 MED ORDER — MIDAZOLAM HCL 2 MG/2ML IJ SOLN
INTRAMUSCULAR | Status: DC | PRN
  Administered 2018-08-11: 2 mg via INTRAVENOUS

## 2018-08-11 MED ORDER — ACETAMINOPHEN 500 MG PO TABS
1000.0000 mg | ORAL_TABLET | Freq: Once | ORAL | Status: AC
  Administered 2018-08-11: 1000 mg via ORAL

## 2018-08-11 MED ORDER — HYDROCODONE-ACETAMINOPHEN 7.5-325 MG PO TABS: 1.0000 | ORAL_TABLET | Freq: Once | ORAL | Status: DC | PRN

## 2018-08-11 MED ORDER — ONDANSETRON HCL 4 MG/2ML IJ SOLN
INTRAMUSCULAR | Status: DC | PRN
  Administered 2018-08-11: 4 mg via INTRAVENOUS

## 2018-08-11 MED ORDER — EPHEDRINE SULFATE 50 MG/ML IJ SOLN
INTRAMUSCULAR | Status: DC | PRN
  Administered 2018-08-11: 5 mg via INTRAVENOUS

## 2018-08-11 MED ORDER — TRAMADOL HCL 50 MG PO TABS: 50.0000 mg | ORAL_TABLET | Freq: Four times a day (QID) | ORAL | 0 refills | Status: DC | PRN

## 2018-08-11 SURGICAL SUPPLY — 17 items
DECANTER SPIKE VIAL GLASS SM (MISCELLANEOUS) IMPLANT
ELECT NDL TIP 2.8 STRL (NEEDLE) IMPLANT
ELECT NEEDLE TIP 2.8 STRL (NEEDLE) IMPLANT
GLOVE BIOGEL PI IND STRL 7.0 (GLOVE) ×2 IMPLANT
GLOVE BIOGEL PI INDICATOR 7.0 (GLOVE) ×1
GLOVE SURG SS PI 7.0 STRL IVOR (GLOVE) ×2 IMPLANT
GLOVE SURG SS PI 8.0 STRL IVOR (GLOVE) ×6 IMPLANT
GOWN STRL REUS W/TWL LRG LVL3 (GOWN DISPOSABLE) ×15 IMPLANT
NEEDLE HYPO 22GX1.5 SAFETY (NEEDLE) ×1 IMPLANT
NS IRRIG 1000ML POUR BTL (IV SOLUTION) ×3 IMPLANT
PACK VAGINAL WOMENS (CUSTOM PROCEDURE TRAY) ×3 IMPLANT
SUT MON AB 2-0 CT2 27 (SUTURE) ×2 IMPLANT
SUT VIC AB 0 CT1 36 (SUTURE) ×1 IMPLANT
SUT VIC AB 2-0 CT2 27 (SUTURE) ×1 IMPLANT
SUT VICRYL 3 0 CT 3 (SUTURE) ×1 IMPLANT
TOWEL OR 17X24 6PK STRL BLUE (TOWEL DISPOSABLE) ×6 IMPLANT
TRAY FOLEY W/BAG SLVR 14FR LF (SET/KITS/TRAYS/PACK) ×1 IMPLANT

## 2018-08-11 NOTE — Transfer of Care (Signed)
Immediate Anesthesia Transfer of Care Note  Patient: Stephanie EvertsBarbara A Savage  Procedure(s) Performed: POSTERIOR REPAIR (RECTOCELE)/Perineorrhaphy (N/A ) REPAIR OF ENTEROCELE  Patient Location: PACU  Anesthesia Type:General  Level of Consciousness: awake, alert  and oriented  Airway & Oxygen Therapy: Patient Spontanous Breathing and Patient connected to nasal cannula oxygen  Post-op Assessment: Report given to RN, Post -op Vital signs reviewed and stable and Patient moving all extremities  Post vital signs: Reviewed and stable  Last Vitals:  Vitals Value Taken Time  BP    Temp    Pulse 69 08/11/2018  2:34 PM  Resp 12 08/11/2018  2:34 PM  SpO2 99 % 08/11/2018  2:34 PM  Vitals shown include unvalidated device data.  Last Pain:  Vitals:   08/11/18 1150  TempSrc: Oral  PainSc: 0-No pain      Patients Stated Pain Goal: 5 (08/11/18 1150)  Complications: No apparent anesthesia complications

## 2018-08-11 NOTE — Discharge Instructions (Signed)
Rectocele Repair/Perinoeorrpahy  This sheet gives you information about how to care for yourself after your procedure. Your health care provider may also give you more specific instructions. If you have problems or questions, contact your health care provider. What can I expect after the procedure? After the procedure, it is common to have:  Pain in the surgical area.  Vaginal discharge. You will need to use a sanitary pad during this time.  Fatigue.  Follow these instructions at home: Incision care  Follow instructions from your health care provider about how to take care of your incision. Make sure you: ? Wash your hands with soap and water before touching the incision area. If soap and water are not available, use hand sanitizer. ? Clean your incision as told by your health care provider. ? Leave stitches (sutures), skin glue, or adhesive strips in place. These skin closures may need to stay in place for 2 weeks or longer. If adhesive strip edges start to loosen and curl up, you may trim the loose edges. Do not remove adhesive strips completely unless your health care provider tells you to do that.  Check your incision area every day for signs of infection. Check for: ? Redness, swelling, or pain. ? Fluid or blood. ? Warmth. ? Pus or a bad smell.  Check your incision every day to make sure the incision area is not separating or opening.  Do not take baths, swim, or use a hot tub until your health care provider approves. You may shower.  Keep the area between your vagina and rectum (perineal area) clean and dry. Make sure you clean the area after each bowel movement and each time you urinate.  Ask your health care provider if you can take a sitz bath or sit in a tub of clean, warm water. Activity  Do gentle, daily activity as told by your health care provider. You may be told to take short walks every day and go farther each time. Ask your health care provider what activities are  safe for you.  Limit stair climbing to once or twice a day in the first week, then slowly increase this activity.  Do not lift anything that is heavier than 10 lbs. (4.5 kg), or the limit that your health care provider tells you, until he or she says that it is safe. Avoid pushing or pulling motions.  Avoid standing for long periods of time.  Do not douche, use tampons, or have sex until your health care provider says it is okay.  Do not drive or use heavy machinery while taking prescription pain medicine. To prevent constipation  To prevent or treat constipation while you are taking prescription pain medicine, your health care provider may recommend that you: ? Take over-the-counter or prescription medicines. ? Eat foods that are high in fiber, such as fresh fruits and vegetables, whole grains, and beans. ? Drink enough fluid to keep your urine clear or pale yellow. ? Limit foods that are high in fat and processed sugars, such as fried and sweet foods. General instructions  You may be instructed to do pelvic floor exercises (kegels) as told by your health care provider.  Take over-the-counter and prescription medicines only as told by your health care provider.  Keep all follow-up visits as told by your health care provider. This is important. Contact a health care provider if:  Medicine does not help your pain.  You have frequent or urgent urination, or you are unable to completely empty  your bladder.  You feel a burning sensation when urinating.  You have fluid or blood coming from your incision.  You have pus or a bad smell coming from the incision.  Your incision feels warm to the touch.  You have redness, swelling, or pain around your incision. Get help right away if:  You have a fever or chills.  Your incision separates or opens.  You cannot urinate.  You have trouble breathing. Summary  After the procedure, it is common to have pain, fatigue, and discharge  from the vagina.  Keep the area between your vagina and rectum (perineal area) clean and dry. Make sure you clean the area after each bowel movement and each time you urinate.  Follow instructions from your health care provider on any activity restrictions after the procedure. This information is not intended to replace advice given to you by your health care provider. Make sure you discuss any questions you have with your health care provider. Document Released: 06/25/2004 Document Revised: 11/11/2016 Document Reviewed: 11/11/2016 Elsevier Interactive Patient Education  2017 [employer] General Anesthesia, Adult, Care After These instructions provide you with information about caring for yourself after your procedure. Your health care provider may also give you more specific instructions. Your treatment has been planned according to current medical practices, but problems sometimes occur. Call your health care provider if you have any problems or questions after your procedure. What can I expect after the procedure? After the procedure, it is common to have:  Vomiting.  A sore throat.  Mental slowness.  It is common to feel:  Nauseous.  Cold or shivery.  Sleepy.  Tired.  Sore or achy, even in parts of your body where you did not have surgery.  Follow these instructions at home: For at least 24 hours after the procedure:  Do not: ? Participate in activities where you could fall or become injured. ? Drive. ? Use heavy machinery. ? Drink alcohol. ? Take sleeping pills or medicines that cause drowsiness. ? Make important decisions or sign legal documents. ? Take care of children on your own.  Rest. Eating and drinking  If you vomit, drink water, juice, or soup when you can drink without vomiting.  Drink enough fluid to keep your urine clear or pale yellow.  Make sure you have little or no nausea before eating solid foods.  Follow the diet recommended by your health  care provider. General instructions  Have a responsible adult stay with you until you are awake and alert.  Return to your normal activities as told by your health care provider. Ask your health care provider what activities are safe for you.  Take over-the-counter and prescription medicines only as told by your health care provider.  If you smoke, do not smoke without supervision.  Keep all follow-up visits as told by your health care provider. This is important. Contact a health care provider if:  You continue to have nausea or vomiting at home, and medicines are not helpful.  You cannot drink fluids or start eating again.  You cannot urinate after 8-12 hours.  You develop a skin rash.  You have fever.  You have increasing redness at the site of your procedure. Get help right away if:  You have difficulty breathing.  You have chest pain.  You have unexpected bleeding.  You feel that you are having a life-threatening or urgent problem. This information is not intended to replace advice given to you by your health care  provider. Make sure you discuss any questions you have with your health care provider. Document Released: 02/17/2001 Document Revised: 04/15/2016 Document Reviewed: 10/26/2015 Elsevier Interactive Patient Education  [employer].

## 2018-08-11 NOTE — H&P (Signed)
Stephanie Savage is an 67 y.o. female. Symptomatic rectocele and perineal relaxation.  Pertinent Gynecological History: Menses: post-menopausal Bleeding: na Contraception: none DES exposure: denies Blood transfusions: none Sexually transmitted diseases: no past history Previous GYN Procedures: na  Last mammogram: normal Date: 2019 Last pap: normal Date: 2019 OB History: G3, P3   Menstrual History: Menarche age: 64 No LMP recorded (lmp unknown). Patient is postmenopausal.    Past Medical History:  Diagnosis Date  . Allergy   . Anemia   . Anxiety   . Arthritis    hands  . Complication of anesthesia    "hypes me up" -wakes up wild  . GERD (gastroesophageal reflux disease)    no med  . Rectocele   . SVD (spontaneous vaginal delivery)    x 2    Past Surgical History:  Procedure Laterality Date  . COLONOSCOPY    . ROTATOR CUFF REPAIR Right 07/22/2013   bone spurs; involved muscle  . shoulder Left [id number]  . SHOULDER ARTHROSCOPY Left   . TONSILLECTOMY    . TONSILLECTOMY AND ADENOIDECTOMY    . TOTAL SHOULDER ARTHROPLASTY Right 04/18/2016   Procedure: TOTAL SHOULDER ARTHROPLASTY;  Surgeon: Jones Broom, MD;  Location: [facility] OR;  Service: Orthopedics;  Laterality: Right;  Right total shoulder arthroplasty  . TOTAL SHOULDER ARTHROPLASTY Left 08/28/2017   Procedure: TOTAL SHOULDER ARTHROPLASTY;  Surgeon: Jones Broom, MD;  Location: [facility];  Service: Orthopedics;  Laterality: Left;  Left shoulder arthroplasty  . TOTAL SHOULDER REPLACEMENT Right 04/18/2016  . WISDOM TOOTH EXTRACTION      Family History  Problem Relation Age of Onset  . Colon cancer Neg Hx   . Colon polyps Neg Hx     Social History:  reports that she has never smoked. She has never used smokeless tobacco. She reports that she drinks alcohol. She reports that she does not use drugs.  Allergies:  Allergies  Allergen Reactions  . Latex Itching    Red skin  . Sulfa Antibiotics Hives  . Codeine Nausea  Only  . Doxycycline Itching and Rash    Big red patches    Medications Prior to Admission  Medication Sig Dispense Refill Last Dose  . Biotin (BIOTIN 5000) 5 MG CAPS Take by mouth.   Past Week at Unknown time  . calcium carbonate (OS-CAL - DOSED IN MG OF ELEMENTAL CALCIUM) 1250 (500 Ca) MG tablet Take 1 tablet by mouth 2 (two) times daily with a meal.   Past Week at Unknown time  . cholecalciferol (VITAMIN D) 1000 UNITS tablet Take 2,000 Units by mouth daily.    Past Week at Unknown time  . diclofenac sodium (VOLTAREN) 1 % GEL Apply topically 2 (two) times daily.   08/10/2018 at Unknown time  . docusate sodium (COLACE) 100 MG capsule Take 1 capsule (100 mg total) by mouth 3 (three) times daily as needed. 20 capsule 0 08/10/2018 at Unknown time  . estradiol-norethindrone (ACTIVELLA) 1-0.5 MG per tablet Take 1 tablet by mouth daily.   08/11/2018 at 0600  . fexofenadine (ALLEGRA) 180 MG tablet Take 180 mg by mouth daily.   08/11/2018 at 0600  . fish oil-omega-3 fatty acids 1000 MG capsule Take 2 g by mouth daily.   Past Week at Unknown time  . fluticasone (FLONASE) 50 MCG/ACT nasal spray Place 1 spray into both nostrils daily.   08/11/2018 at 0600  . MAGNESIUM OXIDE -MG SUPPLEMENT PO Take 400 mg by mouth daily.   Past Week at Unknown  time  . methylphenidate (RITALIN) 20 MG tablet Take 20 mg by mouth 3 (three) times daily with meals.    08/10/2018 at Unknown time  . Multiple Vitamin (MULTIVITAMIN) capsule Take 1 capsule by mouth daily.   Past Week at Unknown time  . niacin 100 MG tablet Take 100 mg by mouth at bedtime.   Past Week at Unknown time  . Probiotic Product (PROBIOTIC DAILY PO) Take 1 tablet by mouth daily.    Past Week at Unknown time  . pyridOXINE (VITAMIN B-6) 100 MG tablet Take 100 mg by mouth daily.   Past Week at Unknown time  . Specialty Vitamins Products (MAGNESIUM, AMINO ACID CHELATE,) 133 MG tablet Take 1 tablet by mouth 2 (two) times daily.   Past Week at Unknown time  . vitamin  B-12 (CYANOCOBALAMIN) 1000 MCG tablet Take 1,000 mcg by mouth daily.   Past Week at Unknown time  . oxyCODONE-acetaminophen (ROXICET) 5-325 MG tablet Take 1-2 tablets by mouth every 4 (four) hours as needed for severe pain. 30 tablet 0     Review of Systems  Constitutional: Negative.   All other systems reviewed and are negative.   Blood pressure (!) 145/102, temperature 98.3 F (36.8 C), temperature source Oral, resp. rate 16, SpO2 99 %. Physical Exam  Nursing note and vitals reviewed. Constitutional: She is oriented to person, place, and time. She appears well-developed and well-nourished.  HENT:  Head: Normocephalic and atraumatic.  Neck: Normal range of motion. Neck supple.  Cardiovascular: Normal rate and regular rhythm.  Respiratory: Effort normal and breath sounds normal.  GI: Soft. Bowel sounds are normal.  Genitourinary: Vagina normal and uterus normal.  Musculoskeletal: Normal range of motion.  Neurological: She is alert and oriented to person, place, and time. She has normal reflexes.  Skin: Skin is warm and dry.  Psychiatric: She has a normal mood and affect.    No results found for this or any previous visit (from the past 24 hour(s)).  No results found.  Assessment/Plan: Symptomatic rectocele and perineal relaxation Posterior colporraphy, poss enterocele repair , poss perineorraphy Surgical consent done. RIsks vs benefits of surgery discussed.   TAAVON,RICHARD J 08/11/2018, 12:24 PM

## 2018-08-11 NOTE — Op Note (Signed)
08/11/2018  2:27 PM  PATIENT:  Stephanie Savage  67 y.o. female  PRE-OPERATIVE DIAGNOSIS:  Symptomatic Pelvic Relaxation  POST-OPERATIVE DIAGNOSIS:  Symptomatic Pelvic Relaxation - rectocele , enterocele and perineal relaxation  PROCEDURE:  Procedure(s): POSTERIOR REPAIR (RECTOCELE)/Perineorrhaphy REPAIR OF ENTEROCELE  SURGEON:  Surgeon(s): Olivia Mackieaavon, Richard, MD  ASSISTANTS: none   ANESTHESIA:   local and general  ESTIMATED BLOOD LOSS: minimal   DRAINS: Urinary Catheter (Foley)   LOCAL MEDICATIONS USED:  MARCAINE    and Amount: 12 ml  SPECIMEN:  No Specimen  DISPOSITION OF SPECIMEN:  N/A  COUNTS:  YES  DICTATION #: [id number]  PLAN OF CARE: dc home  PATIENT DISPOSITION:  PACU - hemodynamically stable.

## 2018-08-11 NOTE — Op Note (Signed)
NAME: Stephanie Savage, [name] MEDICAL RECORD [id number] ACCOUNT [id number] DATE OF BIRTH:August 05, 1951 FACILITY: [facility] LOCATION: [facility] PH[facility]ICIAN:Cathleen Yagi J. Khamarion Bjelland, MD  OPERATIVE REPORT  DATE OF PROCEDURE:  08/11/2018  PREOPERATIVE DIAGNOSIS:  Symptomatic pelvic relaxation with symptomatic rectocele and difficulty with defecation.    POSTOPERATIVE DIAGNOSIS:  Symptomatic pelvic relaxation with symptomatic rectocele and difficulty with defecation, enterocele and perineal relaxation.  PROCEDURE:  Posterior colporrhaphy perineorrhaphy, enterocele repair.  SURGEON:  Olivia Mackieichard Analeia Ismael, MD  ASSISTANT:  None.  ANESTHESIA:  General and local.  ESTIMATED BLOOD LOSS:  Less than 50 mL.  COMPLICATIONS:  None.  DRAINS:  Foley.  COUNTS:  Correct.  INDICATIONS:  The patient to recovery in good condition.  BRIEF OPERATIVE NOTE:  After being apprised of the risks of anesthesia, infection, bleeding, injury to surrounding organs with possible need for repair, inability to solve all pelvic relaxation with the possibility of atypical vaginal vault issues post repair,  The patient was brought to the operating room where she was administered a general anesthetic without complications.  Prepped in the usual sterile fashion.  Foley catheter placed.  Exam under anesthesia reveals a grade II rectocele, questionable enterocele on examination and rectal exam.  At this time, rectal exam was performed and the cephalad portion of the support loss in the midline is identified vaginally and an Allis clamp was placed.  The area was then infiltrated along the perineum and  posterior vaginal mucosa with a dilute Marcaine epinephrine solution.  At this time, a triangular shaped, wedge-shaped piece of skin was excised and the peritoneum and the posterior vaginal wall was undermined using sharp dissection with the Metzenbaum  scissors.  The posterior vaginal mucosa was incised in a vertical fashion and dissection of the  rectovaginal fascia is performed sharply off the posterior vaginal mucosa.  Upon reaching the cephalad portion of the defect, peritoneum was identified consistent with a 2-3 cm defect which is an enterocele.  This is separately dissected and reduced.  The enterocele defect was plicated using a 0 Vicryl suture and 2-0 Monocryl interrupted sutures.  At this time, the rectocele plication was done using 2-0 Monocryl sutures and 0  Vicryl sutures.  The levator plication with 0 Vicryl suture.  Vaginal wall was then trimmed.  The vagina was closed in a vertical fashion using interrupted 2-0 Vicryl sutures and then perineorrhaphy was performed in the standard fashion using a 2-0  Vicryl suture.  Minimal blood loss was noted.  Good hemostasis was noted.  Rectal exam reveals good plication of the defect with no evidence of suture material in the rectum.  At this time, the patient tolerated the procedure well, is awakened and  transferred to recovery in good condition.  TN/NUANCE  D:08/11/2018 T:08/11/2018 JOB:[id number]

## 2018-08-11 NOTE — Anesthesia Procedure Notes (Signed)
Procedure Name: LMA Insertion Date/Time: 08/11/2018 1:24 PM Performed by: Elgie CongoMalinova, Nataliya H, CRNA Pre-anesthesia Checklist: Patient identified, Emergency Drugs available, Suction available and Patient being monitored Patient Re-evaluated:Patient Re-evaluated prior to induction Oxygen Delivery Method: Circle system utilized Preoxygenation: Pre-oxygenation with 100% oxygen Induction Type: IV induction Ventilation: Mask ventilation without difficulty LMA: LMA inserted LMA Size: 3.0 Number of attempts: 1 Placement Confirmation: positive ETCO2 and breath sounds checked- equal and bilateral Tube secured with: Tape Dental Injury: Teeth and Oropharynx as per pre-operative assessment

## 2018-08-12 ENCOUNTER — Encounter (HOSPITAL_COMMUNITY): Payer: Self-pay | Admitting: Obstetrics and Gynecology

## 2018-08-12 NOTE — Anesthesia Postprocedure Evaluation (Signed)
Anesthesia Post Note  Patient: Laren EvertsBarbara A Geppert  Procedure(s) Performed: POSTERIOR REPAIR (RECTOCELE)/Perineorrhaphy (N/A ) REPAIR OF ENTEROCELE     Patient location during evaluation: PACU Anesthesia Type: General Level of consciousness: awake and alert Pain management: pain level controlled Vital Signs Assessment: post-procedure vital signs reviewed and stable Respiratory status: spontaneous breathing, nonlabored ventilation, respiratory function stable and patient connected to nasal cannula oxygen Cardiovascular status: blood pressure returned to baseline and stable Postop Assessment: no apparent nausea or vomiting Anesthetic complications: no    Last Vitals:  Vitals:   08/11/18 1613 08/11/18 1650  BP:  (!) 154/81  Pulse: 62 65  Resp: 12 16  Temp: 36.4 C 36.7 C  SpO2: 100% 100%    Last Pain:  Vitals:   08/11/18 1650  TempSrc: Oral  PainSc: 0-No pain                 Trevor IhaStephen A Houser

## 2018-09-02 DIAGNOSIS — M25512 Pain in left shoulder: Secondary | ICD-10-CM | POA: Diagnosis not present

## 2018-09-11 ENCOUNTER — Other Ambulatory Visit: Payer: Self-pay | Admitting: Obstetrics and Gynecology

## 2018-09-11 DIAGNOSIS — Z780 Asymptomatic menopausal state: Secondary | ICD-10-CM

## 2018-09-11 DIAGNOSIS — E2839 Other primary ovarian failure: Secondary | ICD-10-CM

## 2018-09-11 DIAGNOSIS — Z1231 Encounter for screening mammogram for malignant neoplasm of breast: Secondary | ICD-10-CM

## 2018-09-24 ENCOUNTER — Ambulatory Visit
Admission: RE | Admit: 2018-09-24 | Discharge: 2018-09-24 | Disposition: A | Discharge status: Home / Self Care | Payer: PPO | Source: Ambulatory Visit | Attending: Obstetrics and Gynecology | Admitting: Obstetrics and Gynecology

## 2018-09-24 DIAGNOSIS — Z1231 Encounter for screening mammogram for malignant neoplasm of breast: Secondary | ICD-10-CM

## 2018-11-10 DIAGNOSIS — Z01419 Encounter for gynecological examination (general) (routine) without abnormal findings: Secondary | ICD-10-CM | POA: Diagnosis not present

## 2018-11-16 ENCOUNTER — Ambulatory Visit: Payer: PPO

## 2018-11-16 ENCOUNTER — Other Ambulatory Visit: Payer: PPO

## 2018-11-30 ENCOUNTER — Ambulatory Visit
Admission: RE | Admit: 2018-11-30 | Discharge: 2018-11-30 | Disposition: A | Discharge status: Home / Self Care | Payer: PPO | Source: Ambulatory Visit | Attending: Obstetrics and Gynecology | Admitting: Obstetrics and Gynecology

## 2018-11-30 DIAGNOSIS — M85851 Other specified disorders of bone density and structure, right thigh: Secondary | ICD-10-CM | POA: Diagnosis not present

## 2018-11-30 DIAGNOSIS — Z780 Asymptomatic menopausal state: Secondary | ICD-10-CM

## 2018-11-30 DIAGNOSIS — E2839 Other primary ovarian failure: Secondary | ICD-10-CM

## 2019-01-05 DIAGNOSIS — F908 Attention-deficit hyperactivity disorder, other type: Secondary | ICD-10-CM | POA: Diagnosis not present

## 2019-01-20 DIAGNOSIS — H04123 Dry eye syndrome of bilateral lacrimal glands: Secondary | ICD-10-CM | POA: Diagnosis not present

## 2019-03-04 DIAGNOSIS — E7849 Other hyperlipidemia: Secondary | ICD-10-CM | POA: Diagnosis not present

## 2019-03-08 DIAGNOSIS — R82998 Other abnormal findings in urine: Secondary | ICD-10-CM | POA: Diagnosis not present

## 2019-03-11 DIAGNOSIS — D692 Other nonthrombocytopenic purpura: Secondary | ICD-10-CM | POA: Diagnosis not present

## 2019-03-11 DIAGNOSIS — L309 Dermatitis, unspecified: Secondary | ICD-10-CM | POA: Diagnosis not present

## 2019-03-11 DIAGNOSIS — E785 Hyperlipidemia, unspecified: Secondary | ICD-10-CM | POA: Diagnosis not present

## 2019-03-11 DIAGNOSIS — Z79890 Hormone replacement therapy (postmenopausal): Secondary | ICD-10-CM | POA: Diagnosis not present

## 2019-03-11 DIAGNOSIS — Z1331 Encounter for screening for depression: Secondary | ICD-10-CM | POA: Diagnosis not present

## 2019-03-11 DIAGNOSIS — Z0000 Encounter for general adult medical examination without abnormal findings: Secondary | ICD-10-CM | POA: Diagnosis not present

## 2019-03-11 DIAGNOSIS — F900 Attention-deficit hyperactivity disorder, predominantly inattentive type: Secondary | ICD-10-CM | POA: Diagnosis not present

## 2019-03-11 DIAGNOSIS — M19049 Primary osteoarthritis, unspecified hand: Secondary | ICD-10-CM | POA: Diagnosis not present

## 2019-03-11 DIAGNOSIS — J309 Allergic rhinitis, unspecified: Secondary | ICD-10-CM | POA: Diagnosis not present

## 2019-06-15 DIAGNOSIS — F908 Attention-deficit hyperactivity disorder, other type: Secondary | ICD-10-CM | POA: Diagnosis not present

## 2019-08-05 DIAGNOSIS — L700 Acne vulgaris: Secondary | ICD-10-CM | POA: Diagnosis not present

## 2019-08-31 ENCOUNTER — Other Ambulatory Visit: Payer: Self-pay | Admitting: Obstetrics and Gynecology

## 2019-08-31 DIAGNOSIS — Z1231 Encounter for screening mammogram for malignant neoplasm of breast: Secondary | ICD-10-CM

## 2019-09-13 DIAGNOSIS — F908 Attention-deficit hyperactivity disorder, other type: Secondary | ICD-10-CM | POA: Diagnosis not present

## 2019-10-05 DIAGNOSIS — L718 Other rosacea: Secondary | ICD-10-CM | POA: Diagnosis not present

## 2019-10-05 DIAGNOSIS — Z79899 Other long term (current) drug therapy: Secondary | ICD-10-CM | POA: Diagnosis not present

## 2019-10-19 ENCOUNTER — Other Ambulatory Visit: Payer: Self-pay

## 2019-10-19 ENCOUNTER — Ambulatory Visit
Admission: RE | Admit: 2019-10-19 | Discharge: 2019-10-19 | Disposition: A | Discharge status: Home / Self Care | Payer: PPO | Source: Ambulatory Visit | Attending: Obstetrics and Gynecology | Admitting: Obstetrics and Gynecology

## 2019-10-19 DIAGNOSIS — Z1231 Encounter for screening mammogram for malignant neoplasm of breast: Secondary | ICD-10-CM

## 2019-12-16 DIAGNOSIS — Z79899 Other long term (current) drug therapy: Secondary | ICD-10-CM | POA: Diagnosis not present

## 2019-12-16 DIAGNOSIS — L509 Urticaria, unspecified: Secondary | ICD-10-CM | POA: Diagnosis not present

## 2019-12-28 DIAGNOSIS — Z01419 Encounter for gynecological examination (general) (routine) without abnormal findings: Secondary | ICD-10-CM | POA: Diagnosis not present

## 2020-01-06 DIAGNOSIS — L509 Urticaria, unspecified: Secondary | ICD-10-CM | POA: Diagnosis not present

## 2020-01-08 ENCOUNTER — Ambulatory Visit: Payer: PPO

## 2020-01-11 DIAGNOSIS — F908 Attention-deficit hyperactivity disorder, other type: Secondary | ICD-10-CM | POA: Diagnosis not present

## 2020-01-24 DIAGNOSIS — J309 Allergic rhinitis, unspecified: Secondary | ICD-10-CM | POA: Diagnosis not present

## 2020-01-24 DIAGNOSIS — J300 Vasomotor rhinitis: Secondary | ICD-10-CM | POA: Diagnosis not present

## 2020-01-24 DIAGNOSIS — L501 Idiopathic urticaria: Secondary | ICD-10-CM | POA: Diagnosis not present

## 2020-01-24 DIAGNOSIS — R05 Cough: Secondary | ICD-10-CM | POA: Diagnosis not present

## 2020-02-02 DIAGNOSIS — H04123 Dry eye syndrome of bilateral lacrimal glands: Secondary | ICD-10-CM | POA: Diagnosis not present

## 2020-03-06 DIAGNOSIS — E7849 Other hyperlipidemia: Secondary | ICD-10-CM | POA: Diagnosis not present

## 2020-03-13 DIAGNOSIS — R82998 Other abnormal findings in urine: Secondary | ICD-10-CM | POA: Diagnosis not present

## 2020-03-13 DIAGNOSIS — L509 Urticaria, unspecified: Secondary | ICD-10-CM | POA: Diagnosis not present

## 2020-03-13 DIAGNOSIS — Z1331 Encounter for screening for depression: Secondary | ICD-10-CM | POA: Diagnosis not present

## 2020-03-13 DIAGNOSIS — R030 Elevated blood-pressure reading, without diagnosis of hypertension: Secondary | ICD-10-CM | POA: Diagnosis not present

## 2020-03-13 DIAGNOSIS — M8580 Other specified disorders of bone density and structure, unspecified site: Secondary | ICD-10-CM | POA: Diagnosis not present

## 2020-03-13 DIAGNOSIS — E785 Hyperlipidemia, unspecified: Secondary | ICD-10-CM | POA: Diagnosis not present

## 2020-03-13 DIAGNOSIS — L309 Dermatitis, unspecified: Secondary | ICD-10-CM | POA: Diagnosis not present

## 2020-03-13 DIAGNOSIS — Z0000 Encounter for general adult medical examination without abnormal findings: Secondary | ICD-10-CM | POA: Diagnosis not present

## 2020-03-13 DIAGNOSIS — I7300 Raynaud's syndrome without gangrene: Secondary | ICD-10-CM | POA: Diagnosis not present

## 2020-03-13 DIAGNOSIS — F900 Attention-deficit hyperactivity disorder, predominantly inattentive type: Secondary | ICD-10-CM | POA: Diagnosis not present

## 2020-03-13 DIAGNOSIS — J309 Allergic rhinitis, unspecified: Secondary | ICD-10-CM | POA: Diagnosis not present

## 2020-03-13 DIAGNOSIS — M19049 Primary osteoarthritis, unspecified hand: Secondary | ICD-10-CM | POA: Diagnosis not present

## 2020-03-15 DIAGNOSIS — Z1212 Encounter for screening for malignant neoplasm of rectum: Secondary | ICD-10-CM | POA: Diagnosis not present

## 2020-05-25 ENCOUNTER — Ambulatory Visit: Payer: PPO | Admitting: Podiatry

## 2020-05-25 ENCOUNTER — Ambulatory Visit (INDEPENDENT_AMBULATORY_CARE_PROVIDER_SITE_OTHER): Payer: PPO

## 2020-05-25 ENCOUNTER — Other Ambulatory Visit: Payer: Self-pay

## 2020-05-25 ENCOUNTER — Other Ambulatory Visit: Payer: Self-pay | Admitting: Podiatry

## 2020-05-25 DIAGNOSIS — M21619 Bunion of unspecified foot: Secondary | ICD-10-CM

## 2020-05-25 DIAGNOSIS — M2042 Other hammer toe(s) (acquired), left foot: Secondary | ICD-10-CM

## 2020-05-25 DIAGNOSIS — M2041 Other hammer toe(s) (acquired), right foot: Secondary | ICD-10-CM

## 2020-05-25 NOTE — Patient Instructions (Signed)
Hammer Toe ° °Hammer toe is a change in the shape (a deformity) of your toe. The deformity causes the middle joint of your toe to stay bent. This causes pain, especially when you are wearing shoes. Hammer toe starts gradually. At first, the toe can be straightened. Gradually over time, the deformity becomes stiff and permanent. °Early treatments to keep the toe straight may relieve pain. As the deformity becomes stiff and permanent, surgery may be needed to straighten the toe. °What are the causes? °Hammer toe is caused by abnormal bending of the toe joint that is closest to your foot. It happens gradually over time. This pulls on the muscles and connections (tendons) of the toe joint, making them weak and stiff. It is often related to wearing shoes that are too short or narrow and do not let your toes straighten. °What increases the risk? °You may be at greater risk for hammer toe if you: °· Are female. °· Are older. °· Wear shoes that are too small. °· Wear high-heeled shoes that pinch your toes. °· Are a ballet dancer. °· Have a second toe that is longer than your big toe (first toe). °· Injure your foot or toe. °· Have arthritis. °· Have a family history of hammer toe. °· Have a nerve or muscle disorder. °What are the signs or symptoms? °The main symptoms of this condition are pain and deformity of the toe. The pain is worse when wearing shoes, walking, or running. Other symptoms may include: °· Corns or calluses over the bent part of the toe or between the toes. °· Redness and a burning feeling on the toe. °· An open sore that forms on the top of the toe. °· Not being able to straighten the toe. °How is this diagnosed? °This condition is diagnosed based on your symptoms and a physical exam. During the exam, your health care provider will try to straighten your toe to see how stiff the deformity is. You may also have tests, such as: °· A blood test to check for rheumatoid arthritis. °· An X-ray to show how  severe the deformity is. °How is this treated? °Treatment for this condition will depend on how stiff the deformity is. Surgery is often needed. However, sometimes a hammer toe can be straightened without surgery. Treatments that do not involve surgery include: °· Taping the toe into a straightened position. °· Using pads and cushions to protect the toe (orthotics). °· Wearing shoes that provide enough room for the toes. °· Doing toe-stretching exercises at home. °· Taking an NSAID to reduce pain and swelling. °If these treatments do not help or the toe cannot be straightened, surgery is the next option. The most common surgeries used to straighten a hammer toe include: °· Arthroplasty. In this procedure, part of the joint is removed, and that allows the toe to straighten. °· Fusion. In this procedure, cartilage between the two bones of the joint is taken out and the bones are fused together into one longer bone. °· Implantation. In this procedure, part of the bone is removed and replaced with an implant to let the toe move again. °· Flexor tendon transfer. In this procedure, the tendons that curl the toes down (flexor tendons) are repositioned. °Follow these instructions at home: °· Take over-the-counter and prescription medicines only as told by your health care provider. °· Do toe straightening and stretching exercises as told by your health care provider. °· Keep all follow-up visits as told by your health care   provider. This is important. °How is this prevented? °· Wear shoes that give your toes enough room and do not cause pain. °· Do not wear high-heeled shoes. °Contact a health care provider if: °· Your pain gets worse. °· Your toe becomes red or swollen. °· You develop an open sore on your toe. °This information is not intended to replace advice given to you by your health care provider. Make sure you discuss any questions you have with your health care provider. °Document Revised: 10/24/2017 Document  Reviewed: 03/06/2016 °Elsevier Patient Education © 2020 [employer] °Bunion ° °A bunion is a bump on the base of the big toe that forms when the bones of the big toe joint move out of position. Bunions may be small at first, but they often get larger over time. They can make walking painful. °What are the causes? °A bunion may be caused by: °· Wearing narrow or pointed shoes that force the big toe to press against the other toes. °· Abnormal foot development that causes the foot to roll inward (pronate). °· Changes in the foot that are caused by certain diseases, such as rheumatoid arthritis or polio. °· A foot injury. °What increases the risk? °The following factors may make you more likely to develop this condition: °· Wearing shoes that squeeze the toes together. °· Having certain diseases, such as: °? Rheumatoid arthritis. °? Polio. °? Cerebral palsy. °· Having family members who have bunions. °· Being born with a foot deformity, such as flat feet or low arches. °· Doing activities that put a lot of pressure on the feet, such as ballet dancing. °What are the signs or symptoms? °The main symptom of a bunion is a noticeable bump on the big toe. Other symptoms may include: °· Pain. °· Swelling around the big toe. °· Redness and inflammation. °· Thick or hardened skin on the big toe or between the toes. °· Stiffness or loss of motion in the big toe. °· Trouble with walking. °How is this diagnosed? °A bunion may be diagnosed based on your symptoms, medical history, and activities. You may have tests, such as: °· X-rays. These allow your health care provider to check the position of the bones in your foot and look for damage to your joint. They also help your health care provider determine the severity of your bunion and the best way to treat it. °· Joint aspiration. In this test, a sample of fluid is removed from the toe joint. This test may be done if you are in a lot of pain. It helps rule out diseases that cause  painful swelling of the joints, such as arthritis. °How is this treated? °Treatment depends on the severity of your symptoms. The goal of treatment is to relieve symptoms and prevent the bunion from getting worse. Your health care provider may recommend: °· Wearing shoes that have a wide toe box. °· Using bunion pads to cushion the affected area. °· Taping your toes together to keep them in a normal position. °· Placing a device inside your shoe (orthotics) to help reduce pressure on your toe joint. °· Taking medicine to ease pain, inflammation, and swelling. °· Applying heat or ice to the affected area. °· Doing stretching exercises. °· Surgery to remove scar tissue and move the toes back into their normal position. This treatment is rare. °Follow these instructions at home: °Managing pain, stiffness, and swelling ° °· If directed, put ice on the painful area: °? Put ice in a   plastic bag. °? Place a towel between your skin and the bag. °? Leave the ice on for 20 minutes, 2-3 times a day. °Activity ° °· If directed, apply heat to the affected area before you exercise. Use the heat source that your health care provider recommends, such as a moist heat pack or a heating pad. °? Place a towel between your skin and the heat source. °? Leave the heat on for 20-30 minutes. °? Remove the heat if your skin turns bright red. This is especially important if you are unable to feel pain, heat, or cold. You may have a greater risk of getting burned. °· Do exercises as told by your health care provider. °General instructions °· Support your toe joint with proper footwear, shoe padding, or taping as told by your health care provider. °· Take over-the-counter and prescription medicines only as told by your health care provider. °· Keep all follow-up visits as told by your health care provider. This is important. °Contact a health care provider if your symptoms: °· Get worse. °· Do not improve in 2 weeks. °Get help right away if you  have: °· Severe pain and trouble with walking. °Summary °· A bunion is a bump on the base of the big toe that forms when the bones of the big toe joint move out of position. °· Bunions can make walking painful. °· Treatment depends on the severity of your symptoms. °· Support your toe joint with proper footwear, shoe padding, or taping as told by your health care provider. °This information is not intended to replace advice given to you by your health care provider. Make sure you discuss any questions you have with your health care provider. °Document Revised: 05/18/2018 Document Reviewed: 03/24/2018 °Elsevier Patient Education © 2020 [employer] Inc[employer] ° °

## 2020-05-30 NOTE — Progress Notes (Signed)
Subjective:   Patient ID: Stephanie Savage, female   DOB: 69 y.o.   MRN: [id number]   HPI Patient presents stating she is got a bunion deformity left and has had a problem with digits of both feet.  States that it is hard for her to wear shoe gear at times she tried wider shoes and it seems like gradually became pain is becoming more bothersome.  Patient does not smoke likes to be active    Review of Systems  All other systems reviewed and are negative.       Objective:  Physical Exam Vitals and nursing note reviewed.  Constitutional:      Appearance: She is well-developed.  Pulmonary:     Effort: Pulmonary effort is normal.  Musculoskeletal:        General: Normal range of motion.  Skin:    General: Skin is warm.  Neurological:     Mental Status: She is alert.     Neurovascular status intact muscle strength found to be adequate range of motion within normal limits.  Patient is noted to have moderate structural bunion deformity left with large hyperostosis redness and has digital deformities of the lesser digits bilateral with elevation of the toes noted.  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  I do think at 1 point correction could be discussed but at this point I would rather she use wider shoes softer materials and will be seen back as needed  X-rays indicate significant elevation of the intermetatarsal angle left over right with moderate signs of arthritis

## 2020-06-05 ENCOUNTER — Telehealth: Payer: Self-pay

## 2020-06-05 DIAGNOSIS — L501 Idiopathic urticaria: Secondary | ICD-10-CM | POA: Diagnosis not present

## 2020-06-05 DIAGNOSIS — R05 Cough: Secondary | ICD-10-CM | POA: Diagnosis not present

## 2020-06-05 DIAGNOSIS — J300 Vasomotor rhinitis: Secondary | ICD-10-CM | POA: Diagnosis not present

## 2020-06-05 NOTE — Telephone Encounter (Signed)
DOS 06/13/2020  HAMMERTOE REPAIR 2-5 LT - [id number]  RECEIVED FAX FROM [facility] FOR SURGERY  AUTH # [id number] GOOD FROM 7/20/201 TO 10/18/021

## 2020-06-12 MED ORDER — HYDROCODONE-ACETAMINOPHEN 10-325 MG PO TABS: 1.0000 | ORAL_TABLET | Freq: Four times a day (QID) | ORAL | 0 refills | Status: AC | PRN

## 2020-06-12 MED ORDER — ONDANSETRON HCL 4 MG PO TABS: 4.0000 mg | ORAL_TABLET | Freq: Three times a day (TID) | ORAL | 0 refills | Status: DC | PRN

## 2020-06-12 NOTE — Addendum Note (Signed)
Addended by: Lenn Sink on: 06/12/2020 01:30 PM   Modules accepted: Orders

## 2020-06-13 ENCOUNTER — Encounter: Payer: Self-pay | Admitting: Podiatry

## 2020-06-13 DIAGNOSIS — M2042 Other hammer toe(s) (acquired), left foot: Secondary | ICD-10-CM | POA: Diagnosis not present

## 2020-06-19 DIAGNOSIS — F908 Attention-deficit hyperactivity disorder, other type: Secondary | ICD-10-CM | POA: Diagnosis not present

## 2020-06-21 ENCOUNTER — Ambulatory Visit (INDEPENDENT_AMBULATORY_CARE_PROVIDER_SITE_OTHER): Payer: PPO

## 2020-06-21 ENCOUNTER — Ambulatory Visit (INDEPENDENT_AMBULATORY_CARE_PROVIDER_SITE_OTHER): Payer: PPO | Admitting: Podiatry

## 2020-06-21 ENCOUNTER — Encounter: Payer: Self-pay | Admitting: Podiatry

## 2020-06-21 ENCOUNTER — Other Ambulatory Visit: Payer: Self-pay

## 2020-06-21 DIAGNOSIS — M2042 Other hammer toe(s) (acquired), left foot: Secondary | ICD-10-CM | POA: Diagnosis not present

## 2020-06-22 NOTE — Progress Notes (Signed)
Subjective:   Patient ID: Stephanie Savage, female   DOB: 69 y.o.   MRN: [id number]   HPI Patient states doing well with her left foot states pins are in place and having minimal discomfort   ROS      Objective:  Physical Exam  Neurovascular status intact negative Denna Haggard' sign noted digits in good alignment pins in place 234 wound edges well coapted     Assessment:  Doing well post digital fusions     Plan:  X-rays reviewed sterile preps and sterile dressings reapplied and instructed in continued immobilization elevation compression reappoint 2 weeks or earlier if needed  X-rays indicate pins are in place with good alignment of the underlying bone structure

## 2020-07-05 ENCOUNTER — Other Ambulatory Visit: Payer: Self-pay

## 2020-07-05 ENCOUNTER — Encounter: Payer: Self-pay | Admitting: Podiatry

## 2020-07-05 ENCOUNTER — Ambulatory Visit (INDEPENDENT_AMBULATORY_CARE_PROVIDER_SITE_OTHER): Payer: PPO | Admitting: Podiatry

## 2020-07-05 DIAGNOSIS — M2042 Other hammer toe(s) (acquired), left foot: Secondary | ICD-10-CM | POA: Diagnosis not present

## 2020-07-11 NOTE — Progress Notes (Signed)
Subjective:   Patient ID: Stephanie Savage, female   DOB: 69 y.o.   MRN: [id number]   HPI Patient states doing very well with her foot and so far is very pleased with the results   ROS      Objective:  Physical Exam  Neurovascular status intact negative Homan noted and patient was found to have stitches intact left wound edges well coapted good alignment of the digits     Assessment:  Doing well post digital procedures left     Plan:  Stitches removed x-rays reviewed and patient will be seen back for routine visit and was instructed on continued open toed shoes elevation compression.  Reappoint to recheck x-rays indicate good removal of bone good alignment no indications of pathology

## 2020-07-19 ENCOUNTER — Ambulatory Visit (INDEPENDENT_AMBULATORY_CARE_PROVIDER_SITE_OTHER): Payer: PPO | Admitting: Podiatry

## 2020-07-19 ENCOUNTER — Encounter: Payer: Self-pay | Admitting: Podiatry

## 2020-07-19 ENCOUNTER — Other Ambulatory Visit: Payer: Self-pay

## 2020-07-19 ENCOUNTER — Ambulatory Visit (INDEPENDENT_AMBULATORY_CARE_PROVIDER_SITE_OTHER): Payer: PPO

## 2020-07-19 DIAGNOSIS — M2042 Other hammer toe(s) (acquired), left foot: Secondary | ICD-10-CM | POA: Diagnosis not present

## 2020-07-19 NOTE — Progress Notes (Signed)
Subjective:   Patient ID: Stephanie Savage, female   DOB: 69 y.o.   MRN: [id number]   HPI Patient states she is doing really well and is here for pin removal left   ROS      Objective:  Physical Exam  Neurovascular status intact negative Stephanie Savage' sign noted digits in good alignment pins intact second and third toes with good alignment of the underlying digits     Assessment:  Doing well post digital procedures left     Plan:  Pins removed sterile dressings applied x-rays reviewed and patient can gradual return to soft shoe gear over the next several weeks.  Reappoint 4 to 6 weeks or earlier if needed  X-rays indicate good alignment with the third pin having moved out slightly distal but good structure of the toe clinically

## 2020-08-30 ENCOUNTER — Other Ambulatory Visit: Payer: Self-pay

## 2020-08-30 ENCOUNTER — Ambulatory Visit (INDEPENDENT_AMBULATORY_CARE_PROVIDER_SITE_OTHER): Payer: PPO

## 2020-08-30 ENCOUNTER — Encounter: Payer: Self-pay | Admitting: Podiatry

## 2020-08-30 ENCOUNTER — Ambulatory Visit (INDEPENDENT_AMBULATORY_CARE_PROVIDER_SITE_OTHER): Payer: PPO | Admitting: Podiatry

## 2020-08-30 DIAGNOSIS — M2042 Other hammer toe(s) (acquired), left foot: Secondary | ICD-10-CM

## 2020-08-30 NOTE — Progress Notes (Signed)
Subjective:   Patient ID: Stephanie Savage, female   DOB: 69 y.o.   MRN: [id number]   HPI Patient presents stating I am doing pretty good with swelling still of my second and third toes   ROS      Objective:  Physical Exam  Neurovascular status intact negative Denna Haggard' sign noted wound edges well coapted left with slight swelling of the second and third digits still after previous digital surgery     Assessment:  Doing well post hammertoe repair with normal swelling postop     Plan:  Patient final x-rays reviewed and today on allow patient to return to normal shoe gear as tolerated and explained swelling will last for several more months and she just needs to be patient with it.  Patient agrees and will see me back if symptoms worsen or persist but at this point is doing well  X-rays indicate that there is good alignment of the lesser digits with some compression of the second toe but not pathological chronic

## 2020-09-05 DIAGNOSIS — L501 Idiopathic urticaria: Secondary | ICD-10-CM | POA: Diagnosis not present

## 2020-09-05 DIAGNOSIS — R059 Cough, unspecified: Secondary | ICD-10-CM | POA: Diagnosis not present

## 2020-09-05 DIAGNOSIS — J300 Vasomotor rhinitis: Secondary | ICD-10-CM | POA: Diagnosis not present

## 2020-09-06 DIAGNOSIS — L308 Other specified dermatitis: Secondary | ICD-10-CM | POA: Diagnosis not present

## 2020-09-06 DIAGNOSIS — L509 Urticaria, unspecified: Secondary | ICD-10-CM | POA: Diagnosis not present

## 2020-09-19 DIAGNOSIS — L501 Idiopathic urticaria: Secondary | ICD-10-CM | POA: Diagnosis not present

## 2020-10-26 DIAGNOSIS — L501 Idiopathic urticaria: Secondary | ICD-10-CM | POA: Diagnosis not present

## 2020-11-01 DIAGNOSIS — F908 Attention-deficit hyperactivity disorder, other type: Secondary | ICD-10-CM | POA: Diagnosis not present

## 2020-11-23 DIAGNOSIS — L501 Idiopathic urticaria: Secondary | ICD-10-CM | POA: Diagnosis not present

## 2020-11-29 ENCOUNTER — Other Ambulatory Visit: Payer: Self-pay | Admitting: Obstetrics and Gynecology

## 2020-11-29 DIAGNOSIS — Z1231 Encounter for screening mammogram for malignant neoplasm of breast: Secondary | ICD-10-CM

## 2020-12-19 DIAGNOSIS — L501 Idiopathic urticaria: Secondary | ICD-10-CM | POA: Diagnosis not present

## 2020-12-27 DIAGNOSIS — Z79899 Other long term (current) drug therapy: Secondary | ICD-10-CM | POA: Diagnosis not present

## 2020-12-27 DIAGNOSIS — L509 Urticaria, unspecified: Secondary | ICD-10-CM | POA: Diagnosis not present

## 2020-12-27 DIAGNOSIS — L718 Other rosacea: Secondary | ICD-10-CM | POA: Diagnosis not present

## 2021-01-09 ENCOUNTER — Other Ambulatory Visit: Payer: Self-pay

## 2021-01-09 ENCOUNTER — Ambulatory Visit
Admission: RE | Admit: 2021-01-09 | Discharge: 2021-01-09 | Disposition: A | Discharge status: Home / Self Care | Payer: PPO | Source: Ambulatory Visit | Attending: Obstetrics and Gynecology | Admitting: Obstetrics and Gynecology

## 2021-01-09 DIAGNOSIS — Z1231 Encounter for screening mammogram for malignant neoplasm of breast: Secondary | ICD-10-CM

## 2021-01-11 DIAGNOSIS — Z79890 Hormone replacement therapy (postmenopausal): Secondary | ICD-10-CM | POA: Diagnosis not present

## 2021-01-11 DIAGNOSIS — N815 Vaginal enterocele: Secondary | ICD-10-CM | POA: Diagnosis not present

## 2021-01-11 DIAGNOSIS — Z01411 Encounter for gynecological examination (general) (routine) with abnormal findings: Secondary | ICD-10-CM | POA: Diagnosis not present

## 2021-01-11 DIAGNOSIS — Z6821 Body mass index (BMI) 21.0-21.9, adult: Secondary | ICD-10-CM | POA: Diagnosis not present

## 2021-01-11 DIAGNOSIS — Z124 Encounter for screening for malignant neoplasm of cervix: Secondary | ICD-10-CM | POA: Diagnosis not present

## 2021-01-11 DIAGNOSIS — Z01419 Encounter for gynecological examination (general) (routine) without abnormal findings: Secondary | ICD-10-CM | POA: Diagnosis not present

## 2021-01-12 DIAGNOSIS — Z01411 Encounter for gynecological examination (general) (routine) with abnormal findings: Secondary | ICD-10-CM | POA: Diagnosis not present

## 2021-01-12 DIAGNOSIS — Z124 Encounter for screening for malignant neoplasm of cervix: Secondary | ICD-10-CM | POA: Diagnosis not present

## 2021-01-12 DIAGNOSIS — Z01419 Encounter for gynecological examination (general) (routine) without abnormal findings: Secondary | ICD-10-CM | POA: Diagnosis not present

## 2021-01-15 ENCOUNTER — Other Ambulatory Visit: Payer: Self-pay | Admitting: Obstetrics and Gynecology

## 2021-01-15 DIAGNOSIS — R928 Other abnormal and inconclusive findings on diagnostic imaging of breast: Secondary | ICD-10-CM

## 2021-01-17 DIAGNOSIS — L501 Idiopathic urticaria: Secondary | ICD-10-CM | POA: Diagnosis not present

## 2021-01-29 ENCOUNTER — Ambulatory Visit
Admission: RE | Admit: 2021-01-29 | Discharge: 2021-01-29 | Disposition: A | Discharge status: Home / Self Care | Payer: PPO | Source: Ambulatory Visit | Attending: Obstetrics and Gynecology | Admitting: Obstetrics and Gynecology

## 2021-01-29 ENCOUNTER — Other Ambulatory Visit: Payer: Self-pay

## 2021-01-29 ENCOUNTER — Ambulatory Visit: Payer: PPO

## 2021-01-29 DIAGNOSIS — R928 Other abnormal and inconclusive findings on diagnostic imaging of breast: Secondary | ICD-10-CM

## 2021-02-06 ENCOUNTER — Other Ambulatory Visit: Payer: Self-pay | Admitting: Obstetrics and Gynecology

## 2021-02-06 DIAGNOSIS — M8580 Other specified disorders of bone density and structure, unspecified site: Secondary | ICD-10-CM

## 2021-02-13 DIAGNOSIS — L501 Idiopathic urticaria: Secondary | ICD-10-CM | POA: Diagnosis not present

## 2021-03-02 ENCOUNTER — Other Ambulatory Visit (HOSPITAL_COMMUNITY): Payer: Self-pay | Admitting: Internal Medicine

## 2021-03-02 ENCOUNTER — Encounter: Payer: Self-pay | Admitting: Internal Medicine

## 2021-03-02 ENCOUNTER — Ambulatory Visit (HOSPITAL_COMMUNITY)
Admission: RE | Admit: 2021-03-02 | Discharge: 2021-03-02 | Disposition: A | Discharge status: Home / Self Care | Payer: PPO | Source: Ambulatory Visit | Attending: Internal Medicine | Admitting: Internal Medicine

## 2021-03-02 ENCOUNTER — Other Ambulatory Visit: Payer: Self-pay | Admitting: Internal Medicine

## 2021-03-02 ENCOUNTER — Other Ambulatory Visit: Payer: Self-pay

## 2021-03-02 DIAGNOSIS — Z9889 Other specified postprocedural states: Secondary | ICD-10-CM | POA: Diagnosis not present

## 2021-03-02 DIAGNOSIS — R197 Diarrhea, unspecified: Secondary | ICD-10-CM | POA: Diagnosis not present

## 2021-03-02 DIAGNOSIS — K6289 Other specified diseases of anus and rectum: Secondary | ICD-10-CM | POA: Diagnosis not present

## 2021-03-02 DIAGNOSIS — K6389 Other specified diseases of intestine: Secondary | ICD-10-CM | POA: Diagnosis not present

## 2021-03-02 DIAGNOSIS — R030 Elevated blood-pressure reading, without diagnosis of hypertension: Secondary | ICD-10-CM | POA: Diagnosis not present

## 2021-03-02 DIAGNOSIS — K449 Diaphragmatic hernia without obstruction or gangrene: Secondary | ICD-10-CM | POA: Diagnosis not present

## 2021-03-02 DIAGNOSIS — Z8719 Personal history of other diseases of the digestive system: Secondary | ICD-10-CM | POA: Diagnosis not present

## 2021-03-02 LAB — POCT I-STAT CREATININE: Creatinine, Ser: 0.9 mg/dL (ref 0.44–1.00)

## 2021-03-02 MED ORDER — IOHEXOL 300 MG/ML  SOLN
100.0000 mL | Freq: Once | INTRAMUSCULAR | Status: AC | PRN
  Administered 2021-03-02: 75 mL via INTRAVENOUS

## 2021-03-02 MED ORDER — IOHEXOL 9 MG/ML PO SOLN
1000.0000 mL | ORAL | Status: AC
  Administered 2021-03-02: 1000 mL via ORAL

## 2021-03-02 MED ORDER — IOHEXOL 9 MG/ML PO SOLN
ORAL | Status: AC
  Filled 2021-03-02: qty 1000

## 2021-03-02 NOTE — H&P (Signed)
Seen in our office today 03/02/21 with severe rectal pains, severe tight rectum/anus, No BM in 10 days.  Very tender on Exam. Sent for CT eval  CT showed - 1. Lower rectal wall thickening which appears asymmetric to the left, no well-defined rectal mass. Recommend direct visualization with colonoscopy/sigmoidoscopy. 2. Large volume of stool throughout the colon with rectal distention of stool, suggesting constipation and possible fecal impaction. Sigmoid colonic diverticulosis without diverticulitis. 3. Small hiatal hernia.   Aortic Atherosclerosis (ICD10-I70.0).  I called pt and discussed her case c her: Has seen Dr Maisie Fus at [facility] in past and she called and tried to get seen by her but was given appt for May 15 - I later found out she has not seen Dr Maisie Fus in @ 5 years or more.  She did get an earlier appt on Wednesday c a Scientist, water quality in [place].  When she could not wait much longer she called our office and was seen 03/02/21. Complaints listed above and again included = No BM in 10 days. H/O Rectocele repair. Cannot get in suppository or fingers into anus I read her the CT report and we discussed options She is afraid of the pains and no has Oxy for pain but may not be a good idea to take with her near obstruction. Her last Colon was Dr Rhea Belton 06/2015 - Severe Diverticulosis, melanosis coli 07/2018 - posterior Rectocele repair - c Dr Billy Coast. Has seen Dr Romie Levee in past regarding anal fissure and prolapsing internal hemorrhoid.  I discussed with Gen Surgery on call and they do not think anything surgical is needed yet and at this time - However if pt cannot get cleaned out from below she needs to [facility] ED and get admitted and cleaned out with Gen Surgery and GI consultations ([place]).   Would need admission via Hospitalists. May need Abx - May have Divertic or small abscess or issues at rectocele repair or other issues.  CBC in our office was benign (10.05/14.2/354)   The  plan needs to be some variation of:  Needs to be cleaned out from below. Not obstructed yet If can be cleaned out from below via suppository or enema great but if not then some sort of sedated scope procedure would be needed   I called back to pt and presented this all to her and suggested she goto ED now - she decided to wait till morning and try 1-2 more things and maybe get some sleep.  I told her that was her choice and if she gets worse - do not call back - just go and get seen  Creola Corn

## 2021-03-03 ENCOUNTER — Emergency Department (HOSPITAL_COMMUNITY)
Admission: EM | Admit: 2021-03-03 | Discharge: 2021-03-03 | Disposition: A | Discharge status: Home / Self Care | Payer: PPO | Attending: Emergency Medicine | Admitting: Emergency Medicine

## 2021-03-03 ENCOUNTER — Encounter (HOSPITAL_COMMUNITY): Payer: Self-pay | Admitting: Emergency Medicine

## 2021-03-03 DIAGNOSIS — Z96611 Presence of right artificial shoulder joint: Secondary | ICD-10-CM | POA: Insufficient documentation

## 2021-03-03 DIAGNOSIS — R110 Nausea: Secondary | ICD-10-CM | POA: Insufficient documentation

## 2021-03-03 DIAGNOSIS — Z96612 Presence of left artificial shoulder joint: Secondary | ICD-10-CM | POA: Diagnosis not present

## 2021-03-03 DIAGNOSIS — Z91040 Latex allergy status: Secondary | ICD-10-CM | POA: Diagnosis not present

## 2021-03-03 DIAGNOSIS — K5900 Constipation, unspecified: Secondary | ICD-10-CM | POA: Diagnosis not present

## 2021-03-03 MED ORDER — FLEET ENEMA 7-19 GM/118ML RE ENEM
1.0000 | ENEMA | Freq: Once | RECTAL | Status: AC
  Administered 2021-03-03: 1 via RECTAL
  Filled 2021-03-03: qty 1

## 2021-03-03 MED ORDER — LIDOCAINE HCL 2 % EX GEL: 1.0000 "application " | CUTANEOUS | 0 refills | Status: AC | PRN

## 2021-03-03 MED ORDER — LIDOCAINE HCL URETHRAL/MUCOSAL 2 % EX GEL
1.0000 "application " | Freq: Once | CUTANEOUS | Status: AC
  Administered 2021-03-03: 1 via TOPICAL
  Filled 2021-03-03: qty 11

## 2021-03-03 NOTE — ED Notes (Signed)
RN is an aware that Vitals has not been done because Patient was using the bathroom and has remove blood pressure cuff and pulse soc.

## 2021-03-03 NOTE — ED Notes (Signed)
Pt refused discharge vitals. She states, "I take my BP at home." She declined recheck of vitals.

## 2021-03-03 NOTE — ED Notes (Signed)
Per provider, patient refused MSE.

## 2021-03-03 NOTE — ED Provider Notes (Addendum)
[facility]-EMERGENCY DEPT Provider Note   CSN: [id number] Arrival date & time: 03/03/21  1103     History Chief Complaint  Patient presents with  . Constipation    Stephanie Savage is a 70 y.o. female.  70 year old female presents with constipation times several days.  Patient states several weeks he has had a good bowel movement.  Is been using over-the-counter medications for relief.  Has had nausea no vomiting.  No fever or chills.  Saw her physician for this yesterday and had abdominal CT and was results were reviewed.  Has evidence of severe constipation.  Has been referred to general surgery but that has not until several weeks and presents now for further treatment        Past Medical History:  Diagnosis Date  . Allergy   . Anemia   . Anxiety   . Arthritis    hands  . Complication of anesthesia    "hypes me up" -wakes up wild  . GERD (gastroesophageal reflux disease)    no med  . Rectocele   . SVD (spontaneous vaginal delivery)    x 2    Patient Active Problem List   Diagnosis Date Noted  . S/P shoulder replacement, left 08/28/2017  . Superficial injury of left middle finger with infection 12/03/2016  . Status post total shoulder arthroplasty 04/18/2016  . Chronic right shoulder pain 11/30/2015  . Anal fissure 09/28/2013    Past Surgical History:  Procedure Laterality Date  . COLONOSCOPY    . RECTOCELE REPAIR N/A 08/11/2018   Procedure: POSTERIOR REPAIR (RECTOCELE)/Perineorrhaphy;  Surgeon: Olivia Mackie, MD;  Location: [facility] ORS;  Service: Gynecology;  Laterality: N/A;  . ROTATOR CUFF REPAIR Right 07/22/2013   bone spurs; involved muscle  . shoulder Left [id number]  . SHOULDER ARTHROSCOPY Left   . TONSILLECTOMY    . TONSILLECTOMY AND ADENOIDECTOMY    . TOTAL SHOULDER ARTHROPLASTY Right 04/18/2016   Procedure: TOTAL SHOULDER ARTHROPLASTY;  Surgeon: Jones Broom, MD;  Location: [facility] OR;  Service: Orthopedics;  Laterality: Right;   Right total shoulder arthroplasty  . TOTAL SHOULDER ARTHROPLASTY Left 08/28/2017   Procedure: TOTAL SHOULDER ARTHROPLASTY;  Surgeon: Jones Broom, MD;  Location: [facility];  Service: Orthopedics;  Laterality: Left;  Left shoulder arthroplasty  . TOTAL SHOULDER REPLACEMENT Right 04/18/2016  . WISDOM TOOTH EXTRACTION       OB History   No obstetric history on file.     Family History  Problem Relation Age of Onset  . Colon cancer Neg Hx   . Colon polyps Neg Hx     Social History   Tobacco Use  . Smoking status: Never Smoker  . Smokeless tobacco: Never Used  Vaping Use  . Vaping Use: Never used  Substance Use Topics  . Alcohol use: Yes    Alcohol/week: 0.0 standard drinks    Comment: once yearly  . Drug use: No    Home Medications Prior to Admission medications   Medication Sig Start Date End Date Taking? Authorizing Provider  buPROPion (WELLBUTRIN XL) 150 MG 24 hr tablet Take by mouth. 05/10/20   [provider]  docusate sodium (COLACE) 100 MG capsule Take 1 capsule (100 mg total) by mouth 3 (three) times daily as needed. 04/18/16   Jiles Harold, PA-C  estradiol-norethindrone (ACTIVELLA) 1-0.5 MG per tablet Take 1 tablet by mouth daily.    [provider]  fluticasone (FLONASE) 50 MCG/ACT nasal spray Place 1 spray into both nostrils daily.  [provider]  methylphenidate (RITALIN) 20 MG tablet Take 40 mg by mouth 2 (two) times daily. 06/30/20   [provider]  Multiple Vitamin (MULTIVITAMIN) capsule Take 1 capsule by mouth daily.    [provider]  spironolactone (ALDACTONE) 50 MG tablet Take 50 mg by mouth daily. 04/15/20   [provider]    Allergies    Latex, Sulfa antibiotics, Codeine, and Doxycycline  Review of Systems   Review of Systems  All other systems reviewed and are negative.   Physical Exam Updated Vital Signs BP 98/83 (BP Location: Left Arm)   Pulse 74   Temp 98.4 F (36.9 C) (Oral)    Resp 18   LMP  (LMP Unknown)   SpO2 100%   Physical Exam Vitals and nursing note reviewed.  Constitutional:      General: She is not in acute distress.    Appearance: Normal appearance. She is well-developed. She is not toxic-appearing.  HENT:     Head: Normocephalic and atraumatic.  Eyes:     General: Lids are normal.     Conjunctiva/sclera: Conjunctivae normal.     Pupils: Pupils are equal, round, and reactive to light.  Neck:     Thyroid: No thyroid mass.     Trachea: No tracheal deviation.  Cardiovascular:     Rate and Rhythm: Normal rate and regular rhythm.     Heart sounds: Normal heart sounds. No murmur heard. No gallop.   Pulmonary:     Effort: Pulmonary effort is normal. No respiratory distress.     Breath sounds: Normal breath sounds. No stridor. No decreased breath sounds, wheezing, rhonchi or rales.  Abdominal:     General: Bowel sounds are normal. There is no distension.     Palpations: Abdomen is soft.     Tenderness: There is no abdominal tenderness. There is no rebound.  Genitourinary:    Rectum: Tenderness and external hemorrhoid present.     Comments: Soft stool in rectal vault Musculoskeletal:        General: No tenderness. Normal range of motion.     Cervical back: Normal range of motion and neck supple.  Skin:    General: Skin is warm and dry.     Findings: No abrasion or rash.  Neurological:     Mental Status: She is alert and oriented to person, place, and time.     GCS: GCS eye subscore is 4. GCS verbal subscore is 5. GCS motor subscore is 6.     Cranial Nerves: No cranial nerve deficit.     Sensory: No sensory deficit.  Psychiatric:        Speech: Speech normal.        Behavior: Behavior normal.     ED Results / Procedures / Treatments   Labs (all labs ordered are listed, but only abnormal results are displayed) Labs Reviewed - No data to display  EKG None  Radiology CT ABDOMEN PELVIS W CONTRAST  Result Date: 03/02/2021 CLINICAL  DATA:  Rectal mass.  Diarrhea. EXAM: CT ABDOMEN AND PELVIS WITH CONTRAST TECHNIQUE: Multidetector CT imaging of the abdomen and pelvis was performed using the standard protocol following bolus administration of intravenous contrast. CONTRAST:  36mL OMNIPAQUE IOHEXOL 300 MG/ML  SOLN COMPARISON:  None. FINDINGS: Lower chest: No acute airspace disease or pleural effusion. Normal heart size. Coronary artery calcifications. Hepatobiliary: Homogeneous attenuation of the liver without focal lesion. Gallbladder physiologically distended, no calcified stone. No biliary dilatation. Pancreas: No ductal dilatation  or inflammation. No evidence of pancreatic mass. Spleen: Normal in size without focal abnormality. Adrenals/Urinary Tract: No adrenal nodule. No hydronephrosis or perinephric edema. Homogeneous renal enhancement with symmetric excretion on delayed phase imaging. No focal renal abnormality or stone. Urinary bladder is physiologically distended without wall thickening. Stomach/Bowel: Small hiatal hernia. Decompressed stomach. Normal small bowel without obstruction or inflammation. Administered enteric contrast reaches the ascending colon. Normal terminal ileum. Appendix not definitively visualized. No appendicitis. There is a large volume of stool throughout the colon. Hepatic flexure of the colon interposes under the right hemidiaphragm. Sigmoid colon is redundant. Sigmoid colonic diverticulosis without diverticulitis rectum is distended with stool, rectal distention of 7.2 cm. Distal most rectal wall thickening which appears asymmetric to the left, series 2, image 80, no well-defined rectal mass. No perirectal fat stranding, inflammation, or fluid collection. No liquid stool in the colon to suggest diarrheal process. Vascular/Lymphatic: Moderate aortic atherosclerosis. No aortic aneurysm. Patent portal vein and mesenteric vessels. No enlarged lymph nodes in the abdomen or pelvis. No perirectal adenopathy.  Reproductive: Uterus and bilateral adnexa are unremarkable. Other: No ascites.  No omental disease.  No body wall hernia. Musculoskeletal: Scoliosis with degenerative change in the lumbar spine with Modic endplate changes at L2-L3 and L3-L4. No acute osseous abnormality or focal bone lesion. IMPRESSION: 1. Lower rectal wall thickening which appears asymmetric to the left, no well-defined rectal mass. Recommend direct visualization with colonoscopy/sigmoidoscopy. 2. Large volume of stool throughout the colon with rectal distention of stool, suggesting constipation and possible fecal impaction. Sigmoid colonic diverticulosis without diverticulitis. 3. Small hiatal hernia. Aortic Atherosclerosis (ICD10-I70.0). Electronically Signed   By: Narda Rutherford M.D.   On: 03/02/2021 19:30    Procedures Procedures   Medications Ordered in ED Medications  sodium phosphate Pediatric (FLEET) enema 1 enema (has no administration in time range)    ED Course  I have reviewed the triage vital signs and the nursing notes.  Pertinent labs & imaging results that were available during my care of the patient were reviewed by me and considered in my medical decision making (see chart for details).    MDM Rules/Calculators/A&P                         Patient has had no symptoms suggesting obstruction. Patient given enema by nursing and had good resolve of her constipation.  She will still require follow-up with GI.  Will discharge home.  Return precautions given Final Clinical Impression(s) / ED Diagnoses Final diagnoses:  None    Rx / DC Orders ED Discharge Orders    None       Lorre Nick, MD 03/03/21 Serena Croissant    Lorre Nick, MD 03/03/21 1929

## 2021-03-03 NOTE — Discharge Instructions (Signed)
You will still require follow-up by a gastroenterologist for a colonoscopy to evaluate for other colonic pathology.  Return here for any problems

## 2021-03-03 NOTE — ED Triage Notes (Signed)
Patient seen yesterday for constipation, last stool approximately ten days ago. Reports sent for further evaluation and disimpaction due to continued pain.

## 2021-03-03 NOTE — ED Notes (Signed)
Pt not in lobby when attempting to re-assess vital signs.

## 2021-03-07 DIAGNOSIS — K5641 Fecal impaction: Secondary | ICD-10-CM | POA: Diagnosis not present

## 2021-03-13 DIAGNOSIS — L501 Idiopathic urticaria: Secondary | ICD-10-CM | POA: Diagnosis not present

## 2021-03-29 DIAGNOSIS — N8110 Cystocele, unspecified: Secondary | ICD-10-CM | POA: Diagnosis not present

## 2021-04-10 DIAGNOSIS — L501 Idiopathic urticaria: Secondary | ICD-10-CM | POA: Diagnosis not present

## 2021-04-11 DIAGNOSIS — F908 Attention-deficit hyperactivity disorder, other type: Secondary | ICD-10-CM | POA: Diagnosis not present

## 2021-05-08 DIAGNOSIS — L501 Idiopathic urticaria: Secondary | ICD-10-CM | POA: Diagnosis not present

## 2021-05-09 DIAGNOSIS — H04123 Dry eye syndrome of bilateral lacrimal glands: Secondary | ICD-10-CM | POA: Diagnosis not present

## 2021-06-05 DIAGNOSIS — L501 Idiopathic urticaria: Secondary | ICD-10-CM | POA: Diagnosis not present

## 2021-07-03 DIAGNOSIS — L501 Idiopathic urticaria: Secondary | ICD-10-CM | POA: Diagnosis not present

## 2021-07-25 DIAGNOSIS — N393 Stress incontinence (female) (male): Secondary | ICD-10-CM | POA: Diagnosis not present

## 2021-07-25 DIAGNOSIS — N813 Complete uterovaginal prolapse: Secondary | ICD-10-CM | POA: Diagnosis not present

## 2021-07-25 DIAGNOSIS — R3129 Other microscopic hematuria: Secondary | ICD-10-CM | POA: Diagnosis not present

## 2021-07-25 DIAGNOSIS — K469 Unspecified abdominal hernia without obstruction or gangrene: Secondary | ICD-10-CM | POA: Diagnosis not present

## 2021-08-01 DIAGNOSIS — L501 Idiopathic urticaria: Secondary | ICD-10-CM | POA: Diagnosis not present

## 2021-08-21 DIAGNOSIS — F908 Attention-deficit hyperactivity disorder, other type: Secondary | ICD-10-CM | POA: Diagnosis not present

## 2021-08-29 ENCOUNTER — Other Ambulatory Visit: Payer: Self-pay

## 2021-08-29 ENCOUNTER — Ambulatory Visit
Admission: RE | Admit: 2021-08-29 | Discharge: 2021-08-29 | Disposition: A | Discharge status: Home / Self Care | Payer: PPO | Source: Ambulatory Visit | Attending: Obstetrics and Gynecology | Admitting: Obstetrics and Gynecology

## 2021-08-29 DIAGNOSIS — Z780 Asymptomatic menopausal state: Secondary | ICD-10-CM | POA: Diagnosis not present

## 2021-08-29 DIAGNOSIS — M8580 Other specified disorders of bone density and structure, unspecified site: Secondary | ICD-10-CM

## 2021-08-29 DIAGNOSIS — M85851 Other specified disorders of bone density and structure, right thigh: Secondary | ICD-10-CM | POA: Diagnosis not present

## 2021-09-05 DIAGNOSIS — L501 Idiopathic urticaria: Secondary | ICD-10-CM | POA: Diagnosis not present

## 2021-09-05 DIAGNOSIS — R059 Cough, unspecified: Secondary | ICD-10-CM | POA: Diagnosis not present

## 2021-09-05 DIAGNOSIS — J300 Vasomotor rhinitis: Secondary | ICD-10-CM | POA: Diagnosis not present

## 2021-09-10 DIAGNOSIS — E785 Hyperlipidemia, unspecified: Secondary | ICD-10-CM | POA: Diagnosis not present

## 2021-09-10 DIAGNOSIS — R030 Elevated blood-pressure reading, without diagnosis of hypertension: Secondary | ICD-10-CM | POA: Diagnosis not present

## 2021-09-12 DIAGNOSIS — D692 Other nonthrombocytopenic purpura: Secondary | ICD-10-CM | POA: Diagnosis not present

## 2021-09-12 DIAGNOSIS — E785 Hyperlipidemia, unspecified: Secondary | ICD-10-CM | POA: Diagnosis not present

## 2021-09-12 DIAGNOSIS — M19049 Primary osteoarthritis, unspecified hand: Secondary | ICD-10-CM | POA: Diagnosis not present

## 2021-09-12 DIAGNOSIS — M8580 Other specified disorders of bone density and structure, unspecified site: Secondary | ICD-10-CM | POA: Diagnosis not present

## 2021-09-12 DIAGNOSIS — N816 Rectocele: Secondary | ICD-10-CM | POA: Diagnosis not present

## 2021-09-12 DIAGNOSIS — Z1331 Encounter for screening for depression: Secondary | ICD-10-CM | POA: Diagnosis not present

## 2021-09-12 DIAGNOSIS — L509 Urticaria, unspecified: Secondary | ICD-10-CM | POA: Diagnosis not present

## 2021-09-12 DIAGNOSIS — R82998 Other abnormal findings in urine: Secondary | ICD-10-CM | POA: Diagnosis not present

## 2021-09-12 DIAGNOSIS — I7300 Raynaud's syndrome without gangrene: Secondary | ICD-10-CM | POA: Diagnosis not present

## 2021-09-12 DIAGNOSIS — Z1389 Encounter for screening for other disorder: Secondary | ICD-10-CM | POA: Diagnosis not present

## 2021-09-12 DIAGNOSIS — R030 Elevated blood-pressure reading, without diagnosis of hypertension: Secondary | ICD-10-CM | POA: Diagnosis not present

## 2021-09-12 DIAGNOSIS — F900 Attention-deficit hyperactivity disorder, predominantly inattentive type: Secondary | ICD-10-CM | POA: Diagnosis not present

## 2021-09-12 DIAGNOSIS — Z0000 Encounter for general adult medical examination without abnormal findings: Secondary | ICD-10-CM | POA: Diagnosis not present

## 2021-09-18 DIAGNOSIS — N814 Uterovaginal prolapse, unspecified: Secondary | ICD-10-CM | POA: Diagnosis not present

## 2021-09-18 DIAGNOSIS — N393 Stress incontinence (female) (male): Secondary | ICD-10-CM | POA: Diagnosis not present

## 2021-09-18 DIAGNOSIS — N816 Rectocele: Secondary | ICD-10-CM | POA: Diagnosis not present

## 2021-09-18 DIAGNOSIS — N8110 Cystocele, unspecified: Secondary | ICD-10-CM | POA: Diagnosis not present

## 2021-09-27 DIAGNOSIS — R82998 Other abnormal findings in urine: Secondary | ICD-10-CM | POA: Diagnosis not present

## 2021-10-05 DIAGNOSIS — L501 Idiopathic urticaria: Secondary | ICD-10-CM | POA: Diagnosis not present

## 2021-10-24 DIAGNOSIS — Z09 Encounter for follow-up examination after completed treatment for conditions other than malignant neoplasm: Secondary | ICD-10-CM | POA: Diagnosis not present

## 2021-10-24 DIAGNOSIS — K469 Unspecified abdominal hernia without obstruction or gangrene: Secondary | ICD-10-CM | POA: Diagnosis not present

## 2021-10-24 DIAGNOSIS — N393 Stress incontinence (female) (male): Secondary | ICD-10-CM | POA: Diagnosis not present

## 2021-10-24 DIAGNOSIS — N8110 Cystocele, unspecified: Secondary | ICD-10-CM | POA: Diagnosis not present

## 2021-11-02 DIAGNOSIS — L501 Idiopathic urticaria: Secondary | ICD-10-CM | POA: Diagnosis not present

## 2021-11-30 DIAGNOSIS — L501 Idiopathic urticaria: Secondary | ICD-10-CM | POA: Diagnosis not present

## 2021-12-03 IMAGING — MG MM DIGITAL SCREENING BILAT W/ TOMO AND CAD
8 series · 9 of 24 positions shown · non-contrast
Comparison: Previous exam(s).

CLINICAL DATA: Screening.

EXAM:
DIGITAL SCREENING BILATERAL MAMMOGRAM WITH TOMOSYNTHESIS AND CAD
TECHNIQUE: Bilateral screening digital craniocaudal and mediolateral oblique
mammograms were obtained. Bilateral screening digital breast
tomosynthesis was performed. The images were evaluated with
computer-aided detection.

[L MLO synth-2D]
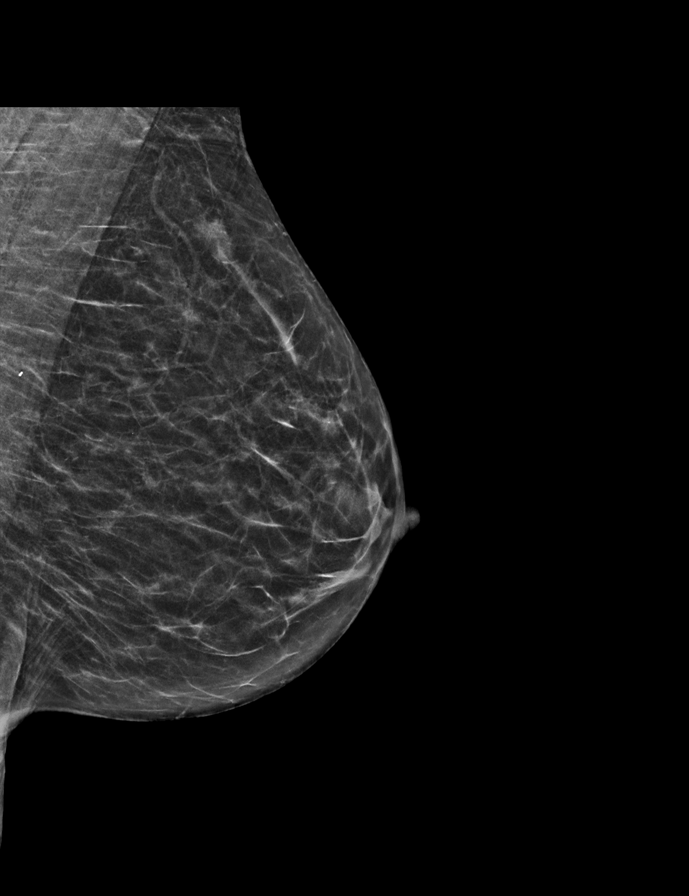

[R CC synth-2D]
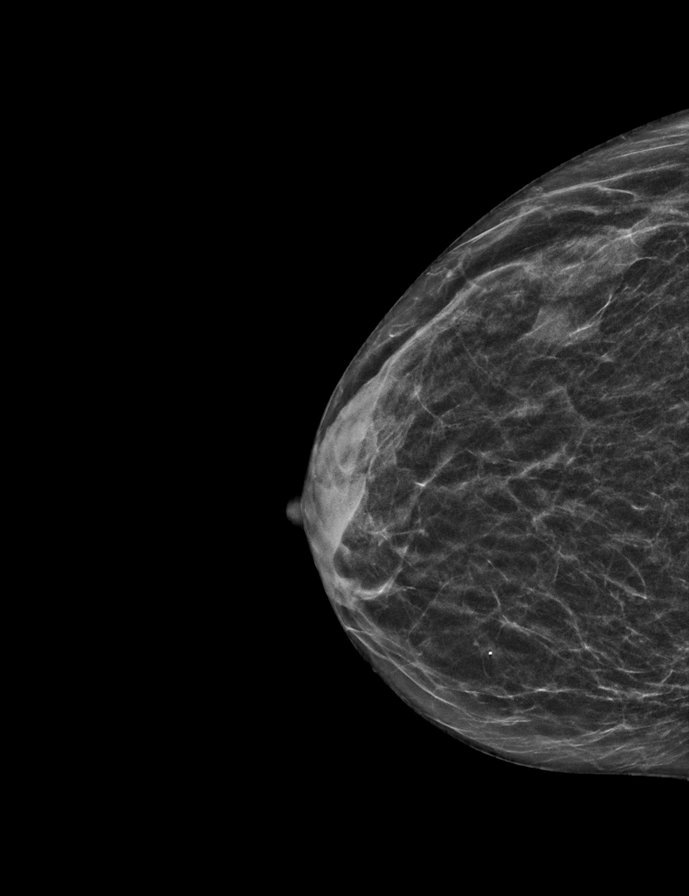

[L CC synth-2D]
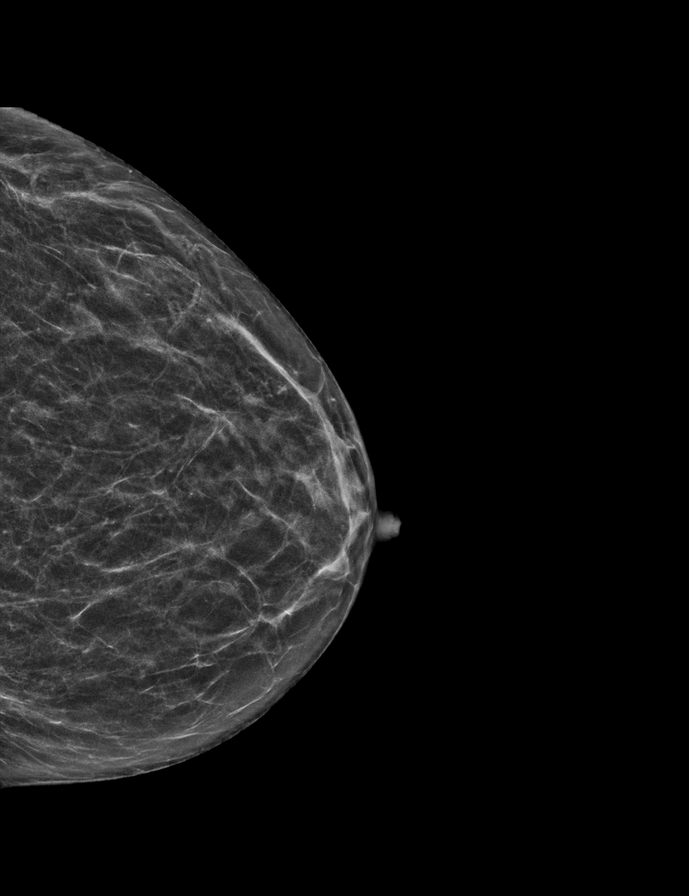

[R MLO synth-2D]
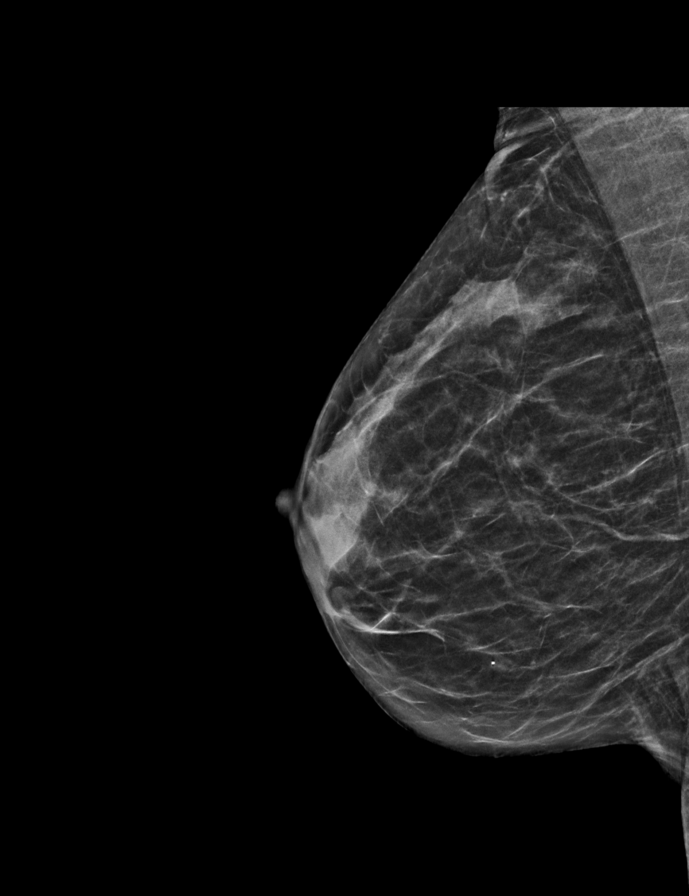

[L CC tomo · 2 of 42 frames shown]
[frame 14/42]
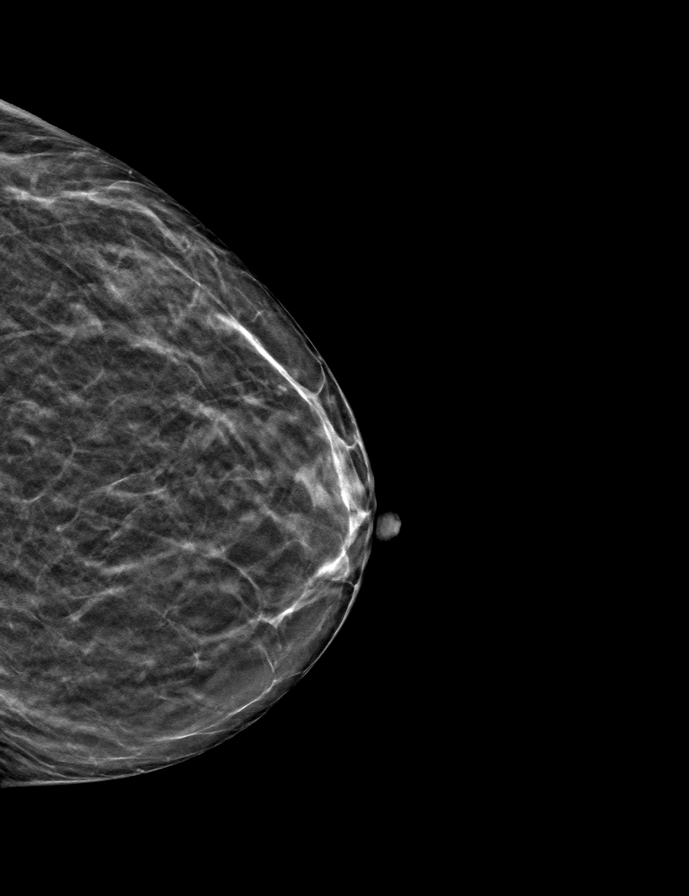
[frame 21/42]
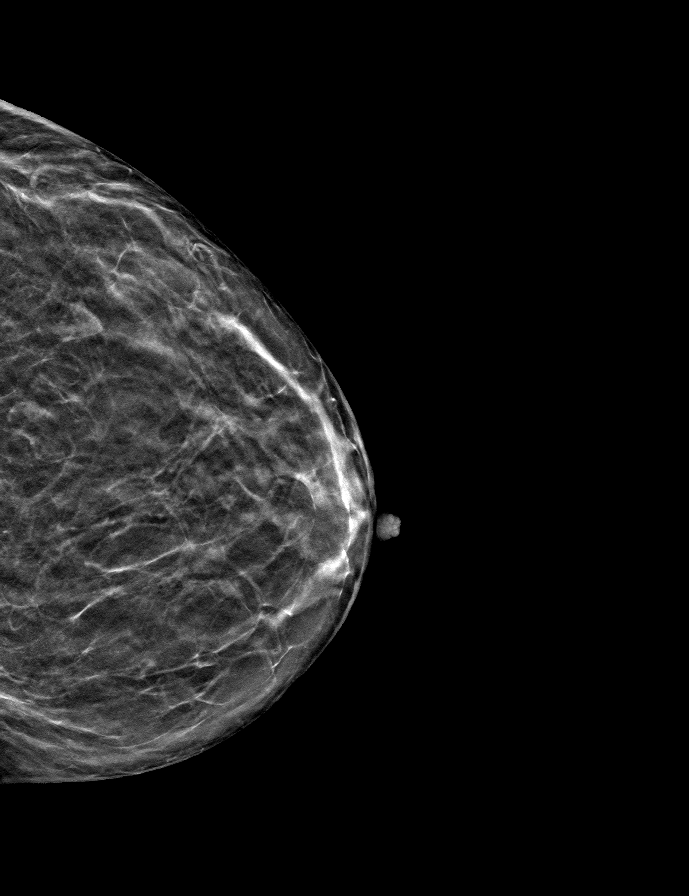

[L MLO tomo · tomo slice 23/45.0]
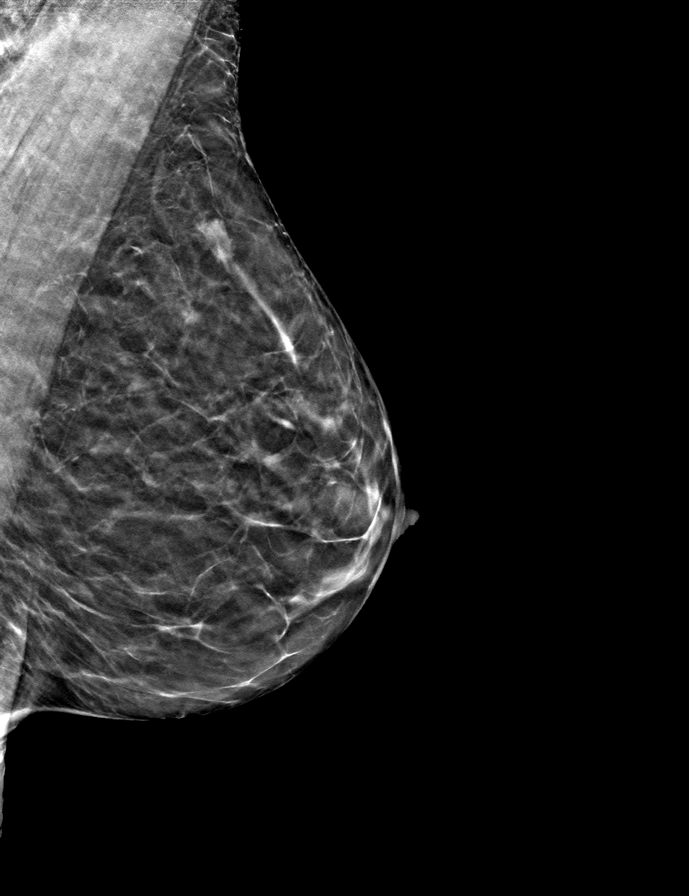

[R MLO tomo · tomo slice 23/44.0]
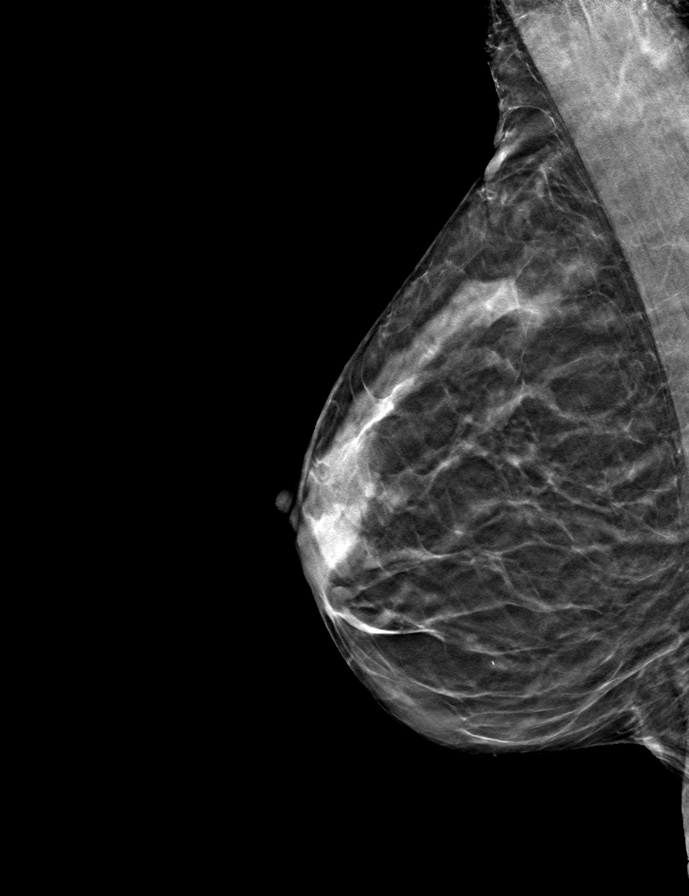

[R CC tomo · tomo slice 21/41.0]
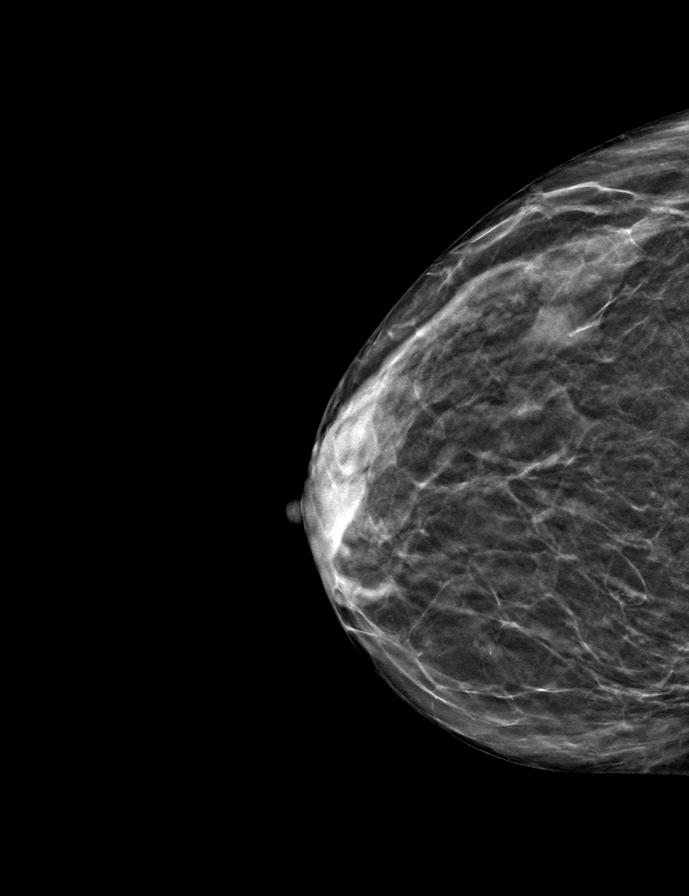

[9 of 24 positions shown; findings below may reference images not displayed]

ACR Breast Density Category b: There are scattered areas of
fibroglandular density.
FINDINGS: In the left breast, a possible asymmetry warrants further
evaluation. In the right breast, no findings suspicious for
malignancy.
IMPRESSION: Further evaluation is suggested for possible asymmetry in the left
breast.

RECOMMENDATION:
Diagnostic mammogram and possibly ultrasound of the left breast.
(Code:[id number])

The patient will be contacted regarding the findings, and additional
imaging will be scheduled.

BI-RADS CATEGORY  0: Incomplete. Need additional imaging evaluation
and/or prior mammograms for comparison.

## 2021-12-27 DIAGNOSIS — L501 Idiopathic urticaria: Secondary | ICD-10-CM | POA: Diagnosis not present

## 2022-01-02 DIAGNOSIS — L718 Other rosacea: Secondary | ICD-10-CM | POA: Diagnosis not present

## 2022-01-02 DIAGNOSIS — L509 Urticaria, unspecified: Secondary | ICD-10-CM | POA: Diagnosis not present

## 2022-01-10 DIAGNOSIS — L308 Other specified dermatitis: Secondary | ICD-10-CM | POA: Diagnosis not present

## 2022-01-10 DIAGNOSIS — L239 Allergic contact dermatitis, unspecified cause: Secondary | ICD-10-CM | POA: Diagnosis not present

## 2022-01-15 DIAGNOSIS — F908 Attention-deficit hyperactivity disorder, other type: Secondary | ICD-10-CM | POA: Diagnosis not present

## 2022-01-21 DIAGNOSIS — L501 Idiopathic urticaria: Secondary | ICD-10-CM | POA: Diagnosis not present

## 2022-02-18 DIAGNOSIS — L501 Idiopathic urticaria: Secondary | ICD-10-CM | POA: Diagnosis not present

## 2022-03-18 DIAGNOSIS — L501 Idiopathic urticaria: Secondary | ICD-10-CM | POA: Diagnosis not present

## 2022-04-16 DIAGNOSIS — L501 Idiopathic urticaria: Secondary | ICD-10-CM | POA: Diagnosis not present

## 2022-05-08 DIAGNOSIS — Z1231 Encounter for screening mammogram for malignant neoplasm of breast: Secondary | ICD-10-CM | POA: Diagnosis not present

## 2022-05-08 DIAGNOSIS — Z01411 Encounter for gynecological examination (general) (routine) with abnormal findings: Secondary | ICD-10-CM | POA: Diagnosis not present

## 2022-05-08 DIAGNOSIS — Z79890 Hormone replacement therapy (postmenopausal): Secondary | ICD-10-CM | POA: Diagnosis not present

## 2022-05-08 DIAGNOSIS — Z0142 Encounter for cervical smear to confirm findings of recent normal smear following initial abnormal smear: Secondary | ICD-10-CM | POA: Diagnosis not present

## 2022-05-08 DIAGNOSIS — Z124 Encounter for screening for malignant neoplasm of cervix: Secondary | ICD-10-CM | POA: Diagnosis not present

## 2022-05-08 DIAGNOSIS — Z01419 Encounter for gynecological examination (general) (routine) without abnormal findings: Secondary | ICD-10-CM | POA: Diagnosis not present

## 2022-05-08 DIAGNOSIS — Z681 Body mass index (BMI) 19 or less, adult: Secondary | ICD-10-CM | POA: Diagnosis not present

## 2022-05-14 DIAGNOSIS — L501 Idiopathic urticaria: Secondary | ICD-10-CM | POA: Diagnosis not present

## 2022-06-11 DIAGNOSIS — L501 Idiopathic urticaria: Secondary | ICD-10-CM | POA: Diagnosis not present

## 2022-06-12 DIAGNOSIS — H16223 Keratoconjunctivitis sicca, not specified as Sjogren's, bilateral: Secondary | ICD-10-CM | POA: Diagnosis not present

## 2022-06-25 DIAGNOSIS — F908 Attention-deficit hyperactivity disorder, other type: Secondary | ICD-10-CM | POA: Diagnosis not present

## 2022-07-09 DIAGNOSIS — L501 Idiopathic urticaria: Secondary | ICD-10-CM | POA: Diagnosis not present

## 2022-08-06 DIAGNOSIS — L501 Idiopathic urticaria: Secondary | ICD-10-CM | POA: Diagnosis not present

## 2022-08-14 DIAGNOSIS — L570 Actinic keratosis: Secondary | ICD-10-CM | POA: Diagnosis not present

## 2022-08-14 DIAGNOSIS — L700 Acne vulgaris: Secondary | ICD-10-CM | POA: Diagnosis not present

## 2022-08-28 DIAGNOSIS — L501 Idiopathic urticaria: Secondary | ICD-10-CM | POA: Diagnosis not present

## 2022-08-28 DIAGNOSIS — J300 Vasomotor rhinitis: Secondary | ICD-10-CM | POA: Diagnosis not present

## 2022-09-03 DIAGNOSIS — L501 Idiopathic urticaria: Secondary | ICD-10-CM | POA: Diagnosis not present

## 2022-09-30 DIAGNOSIS — M8580 Other specified disorders of bone density and structure, unspecified site: Secondary | ICD-10-CM | POA: Diagnosis not present

## 2022-09-30 DIAGNOSIS — E785 Hyperlipidemia, unspecified: Secondary | ICD-10-CM | POA: Diagnosis not present

## 2022-09-30 DIAGNOSIS — R7989 Other specified abnormal findings of blood chemistry: Secondary | ICD-10-CM | POA: Diagnosis not present

## 2022-10-01 DIAGNOSIS — L501 Idiopathic urticaria: Secondary | ICD-10-CM | POA: Diagnosis not present

## 2022-10-07 DIAGNOSIS — R030 Elevated blood-pressure reading, without diagnosis of hypertension: Secondary | ICD-10-CM | POA: Diagnosis not present

## 2022-10-07 DIAGNOSIS — R052 Subacute cough: Secondary | ICD-10-CM | POA: Diagnosis not present

## 2022-10-07 DIAGNOSIS — Z23 Encounter for immunization: Secondary | ICD-10-CM | POA: Diagnosis not present

## 2022-10-07 DIAGNOSIS — I7300 Raynaud's syndrome without gangrene: Secondary | ICD-10-CM | POA: Diagnosis not present

## 2022-10-07 DIAGNOSIS — M8580 Other specified disorders of bone density and structure, unspecified site: Secondary | ICD-10-CM | POA: Diagnosis not present

## 2022-10-07 DIAGNOSIS — F900 Attention-deficit hyperactivity disorder, predominantly inattentive type: Secondary | ICD-10-CM | POA: Diagnosis not present

## 2022-10-07 DIAGNOSIS — Z1339 Encounter for screening examination for other mental health and behavioral disorders: Secondary | ICD-10-CM | POA: Diagnosis not present

## 2022-10-07 DIAGNOSIS — Z0000 Encounter for general adult medical examination without abnormal findings: Secondary | ICD-10-CM | POA: Diagnosis not present

## 2022-10-07 DIAGNOSIS — R82998 Other abnormal findings in urine: Secondary | ICD-10-CM | POA: Diagnosis not present

## 2022-10-07 DIAGNOSIS — D692 Other nonthrombocytopenic purpura: Secondary | ICD-10-CM | POA: Diagnosis not present

## 2022-10-07 DIAGNOSIS — M19049 Primary osteoarthritis, unspecified hand: Secondary | ICD-10-CM | POA: Diagnosis not present

## 2022-10-07 DIAGNOSIS — Z1331 Encounter for screening for depression: Secondary | ICD-10-CM | POA: Diagnosis not present

## 2022-10-07 DIAGNOSIS — H9193 Unspecified hearing loss, bilateral: Secondary | ICD-10-CM | POA: Diagnosis not present

## 2022-10-07 DIAGNOSIS — E785 Hyperlipidemia, unspecified: Secondary | ICD-10-CM | POA: Diagnosis not present

## 2022-10-10 DIAGNOSIS — N815 Vaginal enterocele: Secondary | ICD-10-CM | POA: Diagnosis not present

## 2022-10-10 DIAGNOSIS — Z006 Encounter for examination for normal comparison and control in clinical research program: Secondary | ICD-10-CM | POA: Diagnosis not present

## 2022-10-14 ENCOUNTER — Encounter: Payer: Self-pay | Admitting: Podiatry

## 2022-10-14 ENCOUNTER — Ambulatory Visit: Payer: PPO | Admitting: Podiatry

## 2022-10-14 ENCOUNTER — Ambulatory Visit (INDEPENDENT_AMBULATORY_CARE_PROVIDER_SITE_OTHER): Payer: PPO

## 2022-10-14 DIAGNOSIS — M2042 Other hammer toe(s) (acquired), left foot: Secondary | ICD-10-CM | POA: Diagnosis not present

## 2022-10-14 DIAGNOSIS — M2041 Other hammer toe(s) (acquired), right foot: Secondary | ICD-10-CM

## 2022-10-14 DIAGNOSIS — M21619 Bunion of unspecified foot: Secondary | ICD-10-CM

## 2022-10-16 NOTE — Progress Notes (Signed)
Subjective:   Patient ID: Stephanie Savage, female   DOB: 71 y.o.   MRN: [id number]   HPI Patient presents stating that she is starting to have more more problems with her right foot and she needs to have her toes fixed on that foot.  States the left ones done well she does have trouble with the bunion right it gets sore also   ROS      Objective:  Physical Exam  Neurovascular status intact with patient found to have bone spurs around the first MPJ of the right foot has elevated second toe third toe and rotated fifth digit of the right foot with pain.  She has excellent digital perfusion she is well oriented x 3 and states she has tried wider shoes she has tried padding around the area and she did much better after the left foot was fixed     Assessment:  Structural deformity of the right foot with bunion deformity around the first MPJ more arthritic but spur formation along with digital deformities digits 2 3 and 5     Plan:  H&P reviewed at great length and discussed condition.  I do think we can just remove the bone spurs from the first metatarsal head along with digital fusion digits 2 and 3 and distal arthroplasty digit 5.  I explained the procedures and risk patient wants surgery understands risk and after extensive review of consent form signed.  We reviewed everything is outlined she is comfortable with this and understands total recovery can take 6 months to 1 year and everything decided today  X-rays indicate that there is significant elevation of the second digit there is spurs around the first MPJ with signs of arthritis around the joint surface and digital deformities digits 3 and 5 right with the left foot showing stability there is slight elevation second toe but it has done as good as it can given the condition

## 2022-10-29 DIAGNOSIS — L501 Idiopathic urticaria: Secondary | ICD-10-CM | POA: Diagnosis not present

## 2022-11-26 ENCOUNTER — Telehealth: Payer: Self-pay | Admitting: Podiatry

## 2022-11-26 DIAGNOSIS — L501 Idiopathic urticaria: Secondary | ICD-10-CM | POA: Diagnosis not present

## 2022-11-26 NOTE — Telephone Encounter (Signed)
DOS: 12/17/2022  [employer]  Keller/McBride Bunionectomy Rt ([id number]) Hammertoe Repair 2nd, 3rd, & 5th Rt ([id number])  Out-of-Pocket: $3,450  Authorization #: [id number] Authorization Valid: 12/17/2022 - 03/17/2023

## 2022-12-16 MED ORDER — HYDROCODONE-ACETAMINOPHEN 10-325 MG PO TABS: 1.0000 | ORAL_TABLET | Freq: Three times a day (TID) | ORAL | 0 refills | Status: AC | PRN

## 2022-12-16 NOTE — Addendum Note (Signed)
Addended by: Wallene Huh on: 12/16/2022 01:49 PM   Modules accepted: Orders

## 2022-12-17 ENCOUNTER — Encounter: Payer: Self-pay | Admitting: Podiatry

## 2022-12-17 DIAGNOSIS — M2011 Hallux valgus (acquired), right foot: Secondary | ICD-10-CM

## 2022-12-17 DIAGNOSIS — M2041 Other hammer toe(s) (acquired), right foot: Secondary | ICD-10-CM

## 2022-12-17 DIAGNOSIS — M21611 Bunion of right foot: Secondary | ICD-10-CM | POA: Diagnosis not present

## 2022-12-20 ENCOUNTER — Telehealth: Payer: Self-pay

## 2022-12-21 NOTE — Telephone Encounter (Signed)
Spoke with patient on the telephone. Dressings are dry. No concerns at the moment. F/u nurse schedule Monday, 12/23/2022 at 10AM for dressing change and postop. Patient aware. - Dr. Amalia Hailey

## 2022-12-23 ENCOUNTER — Ambulatory Visit: Payer: PPO

## 2022-12-23 ENCOUNTER — Ambulatory Visit (INDEPENDENT_AMBULATORY_CARE_PROVIDER_SITE_OTHER): Payer: PPO

## 2022-12-23 DIAGNOSIS — M2041 Other hammer toe(s) (acquired), right foot: Secondary | ICD-10-CM | POA: Diagnosis not present

## 2022-12-23 DIAGNOSIS — L501 Idiopathic urticaria: Secondary | ICD-10-CM | POA: Diagnosis not present

## 2022-12-23 DIAGNOSIS — M2042 Other hammer toe(s) (acquired), left foot: Secondary | ICD-10-CM | POA: Diagnosis not present

## 2022-12-23 NOTE — Telephone Encounter (Signed)
Noted, thanks!

## 2022-12-23 NOTE — Progress Notes (Signed)
POV #1 DOS 12/17/2022 [facility] BUNIONECTOMY, HAMMERTOE REPAIR 2,3,5 RIGHT FOOT/DR REGAL PT   Patient presents in office today for first postop she is in a walking boot. She denies any falls or injuries. No calf pain or shortness of breath noted.   X-rays were obtained and reviewed by Dr. Paulla Dolly.   All questions and concerns were answered.  Pt is to return in 2 weeks for suture removal.

## 2022-12-25 DIAGNOSIS — F908 Attention-deficit hyperactivity disorder, other type: Secondary | ICD-10-CM | POA: Diagnosis not present

## 2023-01-06 ENCOUNTER — Ambulatory Visit (INDEPENDENT_AMBULATORY_CARE_PROVIDER_SITE_OTHER): Payer: PPO

## 2023-01-06 VITALS — BP 138/90 | HR 72

## 2023-01-06 DIAGNOSIS — M2042 Other hammer toe(s) (acquired), left foot: Secondary | ICD-10-CM

## 2023-01-06 DIAGNOSIS — M2041 Other hammer toe(s) (acquired), right foot: Secondary | ICD-10-CM

## 2023-01-06 NOTE — Progress Notes (Signed)
Patient presents today for post op visit # 2, patient of Dr. Paulla Dolly.    POV #2 DOS 12/17/2022 [facility] BUNIONECTOMY, HAMMERTOE REPAIR 2,3,5 RIGHT FOOT    Sutures removed today without complications. Patient tolerated suture removal well. Patient states she is doing well overall.   Reviewed icing and elevation. Patient will follow up with Dr. Paulla Dolly  for POV# 3 in 2 weeks for Pin removal.

## 2023-01-21 DIAGNOSIS — L501 Idiopathic urticaria: Secondary | ICD-10-CM | POA: Diagnosis not present

## 2023-01-27 ENCOUNTER — Ambulatory Visit (INDEPENDENT_AMBULATORY_CARE_PROVIDER_SITE_OTHER): Payer: PPO

## 2023-01-27 ENCOUNTER — Ambulatory Visit (INDEPENDENT_AMBULATORY_CARE_PROVIDER_SITE_OTHER): Payer: PPO | Admitting: Podiatry

## 2023-01-27 ENCOUNTER — Encounter: Payer: Self-pay | Admitting: Podiatry

## 2023-01-27 DIAGNOSIS — M2042 Other hammer toe(s) (acquired), left foot: Secondary | ICD-10-CM | POA: Diagnosis not present

## 2023-01-27 DIAGNOSIS — M2041 Other hammer toe(s) (acquired), right foot: Secondary | ICD-10-CM

## 2023-01-27 DIAGNOSIS — K2100 Gastro-esophageal reflux disease with esophagitis, without bleeding: Secondary | ICD-10-CM | POA: Diagnosis not present

## 2023-01-27 DIAGNOSIS — J309 Allergic rhinitis, unspecified: Secondary | ICD-10-CM | POA: Diagnosis not present

## 2023-01-27 DIAGNOSIS — R051 Acute cough: Secondary | ICD-10-CM | POA: Diagnosis not present

## 2023-01-27 DIAGNOSIS — R0602 Shortness of breath: Secondary | ICD-10-CM | POA: Diagnosis not present

## 2023-01-27 NOTE — Progress Notes (Signed)
Subjective:   Patient ID: Stephanie Savage, female   DOB: 72 y.o.   MRN: [id number]   HPI Patient states doing very well with surgery very pleased   ROS      Objective:  Physical Exam  Neurovascular status intact muscle strength adequate toes in reasonably good alignment right slightly elevated but straight and patient very pleased with pins in place     Assessment:  Doing well after having surgery of the right forefoot     Plan:  H&P reviewed condition and removed pins applied sterile dressings discussed exercises to lower the toes and begin wearing regular shoe gear.  Reappoint approximate 4 weeks or earlier if needed  X-rays indicate good alignment toes in reasonably good position slightly elevated and she will work to lower this

## 2023-02-17 DIAGNOSIS — L501 Idiopathic urticaria: Secondary | ICD-10-CM | POA: Diagnosis not present

## 2023-02-27 DIAGNOSIS — M25541 Pain in joints of right hand: Secondary | ICD-10-CM | POA: Diagnosis not present

## 2023-02-27 DIAGNOSIS — W19XXXA Unspecified fall, initial encounter: Secondary | ICD-10-CM | POA: Diagnosis not present

## 2023-02-27 DIAGNOSIS — M542 Cervicalgia: Secondary | ICD-10-CM | POA: Diagnosis not present

## 2023-03-17 DIAGNOSIS — L501 Idiopathic urticaria: Secondary | ICD-10-CM | POA: Diagnosis not present

## 2023-03-18 ENCOUNTER — Other Ambulatory Visit: Payer: Self-pay | Admitting: Obstetrics and Gynecology

## 2023-03-18 DIAGNOSIS — M8580 Other specified disorders of bone density and structure, unspecified site: Secondary | ICD-10-CM

## 2023-04-14 DIAGNOSIS — L501 Idiopathic urticaria: Secondary | ICD-10-CM | POA: Diagnosis not present

## 2023-05-12 DIAGNOSIS — L501 Idiopathic urticaria: Secondary | ICD-10-CM | POA: Diagnosis not present

## 2023-05-21 DIAGNOSIS — Z79890 Hormone replacement therapy (postmenopausal): Secondary | ICD-10-CM | POA: Diagnosis not present

## 2023-05-21 DIAGNOSIS — Z124 Encounter for screening for malignant neoplasm of cervix: Secondary | ICD-10-CM | POA: Diagnosis not present

## 2023-05-21 DIAGNOSIS — N819 Female genital prolapse, unspecified: Secondary | ICD-10-CM | POA: Diagnosis not present

## 2023-05-21 DIAGNOSIS — Z1231 Encounter for screening mammogram for malignant neoplasm of breast: Secondary | ICD-10-CM | POA: Diagnosis not present

## 2023-05-21 DIAGNOSIS — Z01419 Encounter for gynecological examination (general) (routine) without abnormal findings: Secondary | ICD-10-CM | POA: Diagnosis not present

## 2023-05-21 DIAGNOSIS — Z01411 Encounter for gynecological examination (general) (routine) with abnormal findings: Secondary | ICD-10-CM | POA: Diagnosis not present

## 2023-05-21 DIAGNOSIS — Z6820 Body mass index (BMI) 20.0-20.9, adult: Secondary | ICD-10-CM | POA: Diagnosis not present

## 2023-06-03 DIAGNOSIS — F908 Attention-deficit hyperactivity disorder, other type: Secondary | ICD-10-CM | POA: Diagnosis not present

## 2023-06-09 DIAGNOSIS — L501 Idiopathic urticaria: Secondary | ICD-10-CM | POA: Diagnosis not present

## 2023-07-02 DIAGNOSIS — H25813 Combined forms of age-related cataract, bilateral: Secondary | ICD-10-CM | POA: Diagnosis not present

## 2023-07-07 DIAGNOSIS — L501 Idiopathic urticaria: Secondary | ICD-10-CM | POA: Diagnosis not present

## 2023-08-04 DIAGNOSIS — L501 Idiopathic urticaria: Secondary | ICD-10-CM | POA: Diagnosis not present

## 2023-08-29 DIAGNOSIS — L501 Idiopathic urticaria: Secondary | ICD-10-CM | POA: Diagnosis not present

## 2023-08-29 DIAGNOSIS — J300 Vasomotor rhinitis: Secondary | ICD-10-CM | POA: Diagnosis not present

## 2023-09-01 DIAGNOSIS — L501 Idiopathic urticaria: Secondary | ICD-10-CM | POA: Diagnosis not present

## 2023-09-29 DIAGNOSIS — L501 Idiopathic urticaria: Secondary | ICD-10-CM | POA: Diagnosis not present

## 2023-09-30 ENCOUNTER — Ambulatory Visit
Admission: RE | Admit: 2023-09-30 | Discharge: 2023-09-30 | Disposition: A | Discharge status: Home / Self Care | Payer: PPO | Source: Ambulatory Visit | Attending: Obstetrics and Gynecology | Admitting: Obstetrics and Gynecology

## 2023-09-30 DIAGNOSIS — E349 Endocrine disorder, unspecified: Secondary | ICD-10-CM | POA: Diagnosis not present

## 2023-09-30 DIAGNOSIS — M8580 Other specified disorders of bone density and structure, unspecified site: Secondary | ICD-10-CM

## 2023-09-30 DIAGNOSIS — M8588 Other specified disorders of bone density and structure, other site: Secondary | ICD-10-CM | POA: Diagnosis not present

## 2023-09-30 DIAGNOSIS — N958 Other specified menopausal and perimenopausal disorders: Secondary | ICD-10-CM | POA: Diagnosis not present

## 2023-10-22 DIAGNOSIS — Z79899 Other long term (current) drug therapy: Secondary | ICD-10-CM | POA: Diagnosis not present

## 2023-10-22 DIAGNOSIS — E785 Hyperlipidemia, unspecified: Secondary | ICD-10-CM | POA: Diagnosis not present

## 2023-10-22 DIAGNOSIS — R7989 Other specified abnormal findings of blood chemistry: Secondary | ICD-10-CM | POA: Diagnosis not present

## 2023-10-22 DIAGNOSIS — M8580 Other specified disorders of bone density and structure, unspecified site: Secondary | ICD-10-CM | POA: Diagnosis not present

## 2023-10-27 DIAGNOSIS — L501 Idiopathic urticaria: Secondary | ICD-10-CM | POA: Diagnosis not present

## 2023-10-29 DIAGNOSIS — Z79890 Hormone replacement therapy (postmenopausal): Secondary | ICD-10-CM | POA: Diagnosis not present

## 2023-10-29 DIAGNOSIS — I7300 Raynaud's syndrome without gangrene: Secondary | ICD-10-CM | POA: Diagnosis not present

## 2023-10-29 DIAGNOSIS — Z1339 Encounter for screening examination for other mental health and behavioral disorders: Secondary | ICD-10-CM | POA: Diagnosis not present

## 2023-10-29 DIAGNOSIS — R82998 Other abnormal findings in urine: Secondary | ICD-10-CM | POA: Diagnosis not present

## 2023-10-29 DIAGNOSIS — M8580 Other specified disorders of bone density and structure, unspecified site: Secondary | ICD-10-CM | POA: Diagnosis not present

## 2023-10-29 DIAGNOSIS — Z0000 Encounter for general adult medical examination without abnormal findings: Secondary | ICD-10-CM | POA: Diagnosis not present

## 2023-10-29 DIAGNOSIS — Z1331 Encounter for screening for depression: Secondary | ICD-10-CM | POA: Diagnosis not present

## 2023-10-29 DIAGNOSIS — E785 Hyperlipidemia, unspecified: Secondary | ICD-10-CM | POA: Diagnosis not present

## 2023-10-29 DIAGNOSIS — R030 Elevated blood-pressure reading, without diagnosis of hypertension: Secondary | ICD-10-CM | POA: Diagnosis not present

## 2023-10-29 DIAGNOSIS — Z23 Encounter for immunization: Secondary | ICD-10-CM | POA: Diagnosis not present

## 2023-10-29 DIAGNOSIS — D692 Other nonthrombocytopenic purpura: Secondary | ICD-10-CM | POA: Diagnosis not present

## 2023-10-29 DIAGNOSIS — M19049 Primary osteoarthritis, unspecified hand: Secondary | ICD-10-CM | POA: Diagnosis not present

## 2023-10-29 DIAGNOSIS — F900 Attention-deficit hyperactivity disorder, predominantly inattentive type: Secondary | ICD-10-CM | POA: Diagnosis not present

## 2023-11-24 DIAGNOSIS — L501 Idiopathic urticaria: Secondary | ICD-10-CM | POA: Diagnosis not present

## 2023-11-27 DIAGNOSIS — F908 Attention-deficit hyperactivity disorder, other type: Secondary | ICD-10-CM | POA: Diagnosis not present

## 2023-12-22 DIAGNOSIS — L501 Idiopathic urticaria: Secondary | ICD-10-CM | POA: Diagnosis not present

## 2024-01-14 ENCOUNTER — Encounter: Payer: Self-pay | Admitting: Podiatry

## 2024-01-14 ENCOUNTER — Ambulatory Visit (INDEPENDENT_AMBULATORY_CARE_PROVIDER_SITE_OTHER): Payer: PPO

## 2024-01-14 ENCOUNTER — Ambulatory Visit: Payer: PPO | Admitting: Podiatry

## 2024-01-14 DIAGNOSIS — L089 Local infection of the skin and subcutaneous tissue, unspecified: Secondary | ICD-10-CM | POA: Diagnosis not present

## 2024-01-14 DIAGNOSIS — I7301 Raynaud's syndrome with gangrene: Secondary | ICD-10-CM

## 2024-01-14 DIAGNOSIS — S90414A Abrasion, right lesser toe(s), initial encounter: Secondary | ICD-10-CM

## 2024-01-15 NOTE — Progress Notes (Signed)
Subjective:   Patient ID: Stephanie Savage, female   DOB: 73 y.o.   MRN: [id number]   HPI Patient states over the last 4 to 6 weeks she has developed a lot of redness in the right fifth digit with several other digits also showing redness.  Patient has been trying to walk 5 miles a day even in cold weather and does have history of Raynaud's in her fingers   ROS      Objective:  Physical Exam  Neurovascular status intact with patient having cold toes with trauma to the right fifth digit with slight irritation on the inside but no active ulceration with distal keratotic tissue formation and discoloration of the second toe     Assessment:  Strong probability for cold exposure leading to Raynaud's with reduced vascular to the digit creating breakdown of tissue     Plan:  H&P education rendered concerning this condition and that it eventually should heal but risk of ulceration is present but no indication now or indication of ulceration.  She will begin gradually warming her toes I want her to do warm water soaks and I gave strict instructions of avoiding cold and that should hopefully heal uneventfully  X-rays were negative for signs of osteolysis or bony pathology associated with trauma

## 2024-01-19 DIAGNOSIS — L501 Idiopathic urticaria: Secondary | ICD-10-CM | POA: Diagnosis not present

## 2024-01-28 DIAGNOSIS — R030 Elevated blood-pressure reading, without diagnosis of hypertension: Secondary | ICD-10-CM | POA: Diagnosis not present

## 2024-01-28 DIAGNOSIS — T304 Corrosion of unspecified body region, unspecified degree: Secondary | ICD-10-CM | POA: Diagnosis not present

## 2024-01-28 DIAGNOSIS — M7989 Other specified soft tissue disorders: Secondary | ICD-10-CM | POA: Diagnosis not present

## 2024-02-16 DIAGNOSIS — L501 Idiopathic urticaria: Secondary | ICD-10-CM | POA: Diagnosis not present

## 2024-03-15 DIAGNOSIS — L501 Idiopathic urticaria: Secondary | ICD-10-CM | POA: Diagnosis not present

## 2024-04-13 DIAGNOSIS — L501 Idiopathic urticaria: Secondary | ICD-10-CM | POA: Diagnosis not present

## 2024-05-10 DIAGNOSIS — L501 Idiopathic urticaria: Secondary | ICD-10-CM | POA: Diagnosis not present

## 2024-05-17 DIAGNOSIS — F908 Attention-deficit hyperactivity disorder, other type: Secondary | ICD-10-CM | POA: Diagnosis not present

## 2024-05-31 DIAGNOSIS — Z1331 Encounter for screening for depression: Secondary | ICD-10-CM | POA: Diagnosis not present

## 2024-05-31 DIAGNOSIS — Z1231 Encounter for screening mammogram for malignant neoplasm of breast: Secondary | ICD-10-CM | POA: Diagnosis not present

## 2024-05-31 DIAGNOSIS — Z79890 Hormone replacement therapy (postmenopausal): Secondary | ICD-10-CM | POA: Diagnosis not present

## 2024-05-31 DIAGNOSIS — Z01411 Encounter for gynecological examination (general) (routine) with abnormal findings: Secondary | ICD-10-CM | POA: Diagnosis not present

## 2024-06-07 DIAGNOSIS — L501 Idiopathic urticaria: Secondary | ICD-10-CM | POA: Diagnosis not present

## 2024-06-25 DIAGNOSIS — M25512 Pain in left shoulder: Secondary | ICD-10-CM | POA: Diagnosis not present

## 2024-06-25 DIAGNOSIS — M25511 Pain in right shoulder: Secondary | ICD-10-CM | POA: Diagnosis not present

## 2024-06-29 DIAGNOSIS — M25611 Stiffness of right shoulder, not elsewhere classified: Secondary | ICD-10-CM | POA: Diagnosis not present

## 2024-06-29 DIAGNOSIS — Z96611 Presence of right artificial shoulder joint: Secondary | ICD-10-CM | POA: Diagnosis not present

## 2024-06-29 DIAGNOSIS — M6281 Muscle weakness (generalized): Secondary | ICD-10-CM | POA: Diagnosis not present

## 2024-07-02 DIAGNOSIS — Z96611 Presence of right artificial shoulder joint: Secondary | ICD-10-CM | POA: Diagnosis not present

## 2024-07-02 DIAGNOSIS — M25611 Stiffness of right shoulder, not elsewhere classified: Secondary | ICD-10-CM | POA: Diagnosis not present

## 2024-07-02 DIAGNOSIS — M6281 Muscle weakness (generalized): Secondary | ICD-10-CM | POA: Diagnosis not present

## 2024-07-05 DIAGNOSIS — L501 Idiopathic urticaria: Secondary | ICD-10-CM | POA: Diagnosis not present

## 2024-07-05 DIAGNOSIS — H25813 Combined forms of age-related cataract, bilateral: Secondary | ICD-10-CM | POA: Diagnosis not present

## 2024-07-06 DIAGNOSIS — Z96611 Presence of right artificial shoulder joint: Secondary | ICD-10-CM | POA: Diagnosis not present

## 2024-07-06 DIAGNOSIS — M6281 Muscle weakness (generalized): Secondary | ICD-10-CM | POA: Diagnosis not present

## 2024-07-06 DIAGNOSIS — M25611 Stiffness of right shoulder, not elsewhere classified: Secondary | ICD-10-CM | POA: Diagnosis not present

## 2024-07-08 DIAGNOSIS — Z96611 Presence of right artificial shoulder joint: Secondary | ICD-10-CM | POA: Diagnosis not present

## 2024-07-08 DIAGNOSIS — M6281 Muscle weakness (generalized): Secondary | ICD-10-CM | POA: Diagnosis not present

## 2024-07-08 DIAGNOSIS — M25611 Stiffness of right shoulder, not elsewhere classified: Secondary | ICD-10-CM | POA: Diagnosis not present

## 2024-07-13 DIAGNOSIS — M25611 Stiffness of right shoulder, not elsewhere classified: Secondary | ICD-10-CM | POA: Diagnosis not present

## 2024-07-13 DIAGNOSIS — M6281 Muscle weakness (generalized): Secondary | ICD-10-CM | POA: Diagnosis not present

## 2024-07-13 DIAGNOSIS — Z96611 Presence of right artificial shoulder joint: Secondary | ICD-10-CM | POA: Diagnosis not present

## 2024-07-15 DIAGNOSIS — Z96611 Presence of right artificial shoulder joint: Secondary | ICD-10-CM | POA: Diagnosis not present

## 2024-07-15 DIAGNOSIS — M6281 Muscle weakness (generalized): Secondary | ICD-10-CM | POA: Diagnosis not present

## 2024-07-15 DIAGNOSIS — M25611 Stiffness of right shoulder, not elsewhere classified: Secondary | ICD-10-CM | POA: Diagnosis not present

## 2024-07-20 DIAGNOSIS — Z96611 Presence of right artificial shoulder joint: Secondary | ICD-10-CM | POA: Diagnosis not present

## 2024-07-20 DIAGNOSIS — M25611 Stiffness of right shoulder, not elsewhere classified: Secondary | ICD-10-CM | POA: Diagnosis not present

## 2024-07-20 DIAGNOSIS — M6281 Muscle weakness (generalized): Secondary | ICD-10-CM | POA: Diagnosis not present

## 2024-07-22 DIAGNOSIS — Z96611 Presence of right artificial shoulder joint: Secondary | ICD-10-CM | POA: Diagnosis not present

## 2024-07-22 DIAGNOSIS — M25611 Stiffness of right shoulder, not elsewhere classified: Secondary | ICD-10-CM | POA: Diagnosis not present

## 2024-07-22 DIAGNOSIS — M6281 Muscle weakness (generalized): Secondary | ICD-10-CM | POA: Diagnosis not present

## 2024-07-27 DIAGNOSIS — Z96611 Presence of right artificial shoulder joint: Secondary | ICD-10-CM | POA: Diagnosis not present

## 2024-07-27 DIAGNOSIS — M6281 Muscle weakness (generalized): Secondary | ICD-10-CM | POA: Diagnosis not present

## 2024-07-27 DIAGNOSIS — M25611 Stiffness of right shoulder, not elsewhere classified: Secondary | ICD-10-CM | POA: Diagnosis not present

## 2024-08-02 DIAGNOSIS — M25511 Pain in right shoulder: Secondary | ICD-10-CM | POA: Diagnosis not present

## 2024-08-02 DIAGNOSIS — Z96611 Presence of right artificial shoulder joint: Secondary | ICD-10-CM | POA: Diagnosis not present

## 2024-08-02 DIAGNOSIS — L501 Idiopathic urticaria: Secondary | ICD-10-CM | POA: Diagnosis not present

## 2024-08-02 DIAGNOSIS — M6281 Muscle weakness (generalized): Secondary | ICD-10-CM | POA: Diagnosis not present

## 2024-08-02 DIAGNOSIS — M25611 Stiffness of right shoulder, not elsewhere classified: Secondary | ICD-10-CM | POA: Diagnosis not present

## 2024-08-05 DIAGNOSIS — M25611 Stiffness of right shoulder, not elsewhere classified: Secondary | ICD-10-CM | POA: Diagnosis not present

## 2024-08-05 DIAGNOSIS — M6281 Muscle weakness (generalized): Secondary | ICD-10-CM | POA: Diagnosis not present

## 2024-08-05 DIAGNOSIS — Z96611 Presence of right artificial shoulder joint: Secondary | ICD-10-CM | POA: Diagnosis not present

## 2024-08-10 DIAGNOSIS — M6281 Muscle weakness (generalized): Secondary | ICD-10-CM | POA: Diagnosis not present

## 2024-08-10 DIAGNOSIS — Z96611 Presence of right artificial shoulder joint: Secondary | ICD-10-CM | POA: Diagnosis not present

## 2024-08-10 DIAGNOSIS — M25661 Stiffness of right knee, not elsewhere classified: Secondary | ICD-10-CM | POA: Diagnosis not present

## 2024-08-12 DIAGNOSIS — Z96611 Presence of right artificial shoulder joint: Secondary | ICD-10-CM | POA: Diagnosis not present

## 2024-08-12 DIAGNOSIS — M8580 Other specified disorders of bone density and structure, unspecified site: Secondary | ICD-10-CM | POA: Diagnosis not present

## 2024-08-12 DIAGNOSIS — K219 Gastro-esophageal reflux disease without esophagitis: Secondary | ICD-10-CM | POA: Diagnosis not present

## 2024-08-12 DIAGNOSIS — M6281 Muscle weakness (generalized): Secondary | ICD-10-CM | POA: Diagnosis not present

## 2024-08-12 DIAGNOSIS — M25611 Stiffness of right shoulder, not elsewhere classified: Secondary | ICD-10-CM | POA: Diagnosis not present

## 2024-08-12 DIAGNOSIS — M25511 Pain in right shoulder: Secondary | ICD-10-CM | POA: Diagnosis not present

## 2024-08-12 DIAGNOSIS — L700 Acne vulgaris: Secondary | ICD-10-CM | POA: Diagnosis not present

## 2024-08-23 DIAGNOSIS — M25511 Pain in right shoulder: Secondary | ICD-10-CM | POA: Diagnosis not present

## 2024-08-30 DIAGNOSIS — L501 Idiopathic urticaria: Secondary | ICD-10-CM | POA: Diagnosis not present

## 2024-09-27 DIAGNOSIS — L501 Idiopathic urticaria: Secondary | ICD-10-CM | POA: Diagnosis not present

## 2024-10-25 DIAGNOSIS — L501 Idiopathic urticaria: Secondary | ICD-10-CM | POA: Diagnosis not present

## 2024-10-27 DIAGNOSIS — L501 Idiopathic urticaria: Secondary | ICD-10-CM | POA: Diagnosis not present

## 2024-10-27 DIAGNOSIS — J300 Vasomotor rhinitis: Secondary | ICD-10-CM | POA: Diagnosis not present
